# Patient Record
Sex: Male | Born: 1965 | Race: Black or African American | Hispanic: No | Marital: Single | Smoking: Never smoker
Health system: Southern US, Community
[De-identification: ages and names within clinical notes are randomized; demographics above are authoritative.]

## PROBLEM LIST (undated history)

## (undated) ENCOUNTER — Emergency Department (HOSPITAL_BASED_OUTPATIENT_CLINIC_OR_DEPARTMENT_OTHER)

## (undated) DIAGNOSIS — K219 Gastro-esophageal reflux disease without esophagitis: Secondary | ICD-10-CM

## (undated) DIAGNOSIS — I10 Essential (primary) hypertension: Secondary | ICD-10-CM

## (undated) DIAGNOSIS — IMO0002 Reserved for concepts with insufficient information to code with codable children: Secondary | ICD-10-CM

## (undated) DIAGNOSIS — M1990 Unspecified osteoarthritis, unspecified site: Secondary | ICD-10-CM

## (undated) DIAGNOSIS — G4730 Sleep apnea, unspecified: Secondary | ICD-10-CM

## (undated) DIAGNOSIS — L98499 Non-pressure chronic ulcer of skin of other sites with unspecified severity: Secondary | ICD-10-CM

## (undated) DIAGNOSIS — R229 Localized swelling, mass and lump, unspecified: Secondary | ICD-10-CM

## (undated) DIAGNOSIS — R519 Headache, unspecified: Secondary | ICD-10-CM

## (undated) DIAGNOSIS — G8929 Other chronic pain: Secondary | ICD-10-CM

## (undated) DIAGNOSIS — M549 Dorsalgia, unspecified: Secondary | ICD-10-CM

## (undated) DIAGNOSIS — IMO0001 Reserved for inherently not codable concepts without codable children: Secondary | ICD-10-CM

## (undated) HISTORY — PX: TOTAL KNEE ARTHROPLASTY: SHX125

## (undated) HISTORY — PX: ANTERIOR CRUCIATE LIGAMENT REPAIR: SHX115

## (undated) HISTORY — PX: BACK SURGERY: SHX140

---

## 2001-09-23 ENCOUNTER — Emergency Department (HOSPITAL_COMMUNITY): Admission: EM | Admit: 2001-09-23 | Discharge: 2001-09-23 | Payer: Self-pay | Admitting: *Deleted

## 2001-09-23 ENCOUNTER — Encounter: Payer: Self-pay | Admitting: *Deleted

## 2001-11-01 ENCOUNTER — Ambulatory Visit (HOSPITAL_COMMUNITY): Admission: RE | Admit: 2001-11-01 | Discharge: 2001-11-01 | Payer: Self-pay | Admitting: Orthopedic Surgery

## 2001-11-01 ENCOUNTER — Encounter: Payer: Self-pay | Admitting: Orthopedic Surgery

## 2006-08-30 ENCOUNTER — Emergency Department (HOSPITAL_COMMUNITY): Admission: EM | Admit: 2006-08-30 | Discharge: 2006-08-30 | Payer: Self-pay | Admitting: Emergency Medicine

## 2006-08-31 ENCOUNTER — Ambulatory Visit (HOSPITAL_BASED_OUTPATIENT_CLINIC_OR_DEPARTMENT_OTHER): Admission: RE | Admit: 2006-08-31 | Discharge: 2006-08-31 | Payer: Self-pay | Admitting: *Deleted

## 2006-08-31 ENCOUNTER — Encounter (INDEPENDENT_AMBULATORY_CARE_PROVIDER_SITE_OTHER): Payer: Self-pay | Admitting: *Deleted

## 2007-07-29 ENCOUNTER — Emergency Department (HOSPITAL_COMMUNITY): Admission: EM | Admit: 2007-07-29 | Discharge: 2007-07-29 | Payer: Self-pay | Admitting: Emergency Medicine

## 2010-06-25 NOTE — Op Note (Signed)
Evan Kim, Evan Kim            ACCOUNT NO.:  [id number]   MEDICAL RECORD NO.:  [id number]          PATIENT TYPE:  AMB   LOCATION:  DSC                          FACILITY:  [facility]   PHYSICIAN:  Tennis Must Meyerdierks, M.D.DATE OF BIRTH:  Mar 30, 1965   DATE OF PROCEDURE:  08/31/2006  DATE OF DISCHARGE:                               OPERATIVE REPORT   PREOPERATIVE DIAGNOSES:  Osteomyelitis, distal phalanx left long finger.   POSTOPERATIVE DIAGNOSES:  Osteomyelitis, distal phalanx left long  finger.   PROCEDURE:  Incision and drainage of abscess left long finger.   SURGEON:  Dr. Metro Kung.   ANESTHESIA:  General.   OPERATIVE FINDINGS:  The patient had gross purulent material beneath the  eponychium fold and in the pulp of the long finger.  The bone was quite  solid.   PROCEDURE:  Under general anesthesia with a tourniquet on the left arm,  the left hand was prepped and draped in the usual fashion and after  exsanguinating the limb the tourniquet was inflated to 250 mmHg.  A  Freer elevator was placed under the eponychium fold and gross purulence  was obtained.  Cultures were sent to the lab.  The nail plate was then  removed with a Therapist, nutritional.  A longitudinal incision was made on the  radial side of the distal phalanx in line with the previous incision  made in the emergency room.  It was carried through the subcutaneous  tissues and again gross purulent material was obtained.  Blunt  dissection was carried across the volar aspect of the distal phalanx  releasing the pressure on the pulp.  The bone was then curetted and a  small fragment was taken to send to the lab to evaluate for  osteomyelitis.  The wound was then irrigated copiously.  It was packed  open with Iodoform packing.  Sterile dressings were applied.  A 0.5%  Marcaine digital block was inserted for pain control.  The patient  tolerated the procedure well and went to the recovery room awake in  stable and  good condition.      Lowell Bouton, M.D.  Electronically Signed     EMM/MEDQ  D:  08/31/2006  T:  08/31/2006  Job:  [id number]

## 2010-06-28 NOTE — Op Note (Signed)
NAME:  Evan Kim, Evan Kim                      ACCOUNT NO.:  [id number]   MEDICAL RECORD NO.:  [id number]                   PATIENT TYPE:  AMB   LOCATION:  DAY                                  FACILITY:  [facility]   PHYSICIAN:  James P. Aplington, M.D.            DATE OF BIRTH:  [birth year]   DATE OF PROCEDURE:  DATE OF DISCHARGE:                                 OPERATIVE REPORT   PREOPERATIVE DIAGNOSES:  1. Chronic pain, swelling and giving way, right knee.  2. Osteoarthritis, right knee.  3. Retained screws and washers femur and tibia, right knee, status post     anterior cruciate ligament reconstruction.   POSTOPERATIVE DIAGNOSES:  1. Torn lateral and medial menisci.  2. 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  3. Retained screw and washer, right femur and tibia.   OPERATION:  1. Right knee arthroscopy with 1) partial medial and lateral meniscectomy,     2) debridement of medial femoral condyle and patella.  2. Removal of screw and washer, right tibia.  3. Attempted removal of screw and washer, right femur.   SURGEON:  Illene Labrador. Aplington, M.D.   ASSISTANT:  Nurse.   PATHOLOGY AND JUSTIFICATION FOR PROCEDURE:  Some 15-16 years ago, he had ACL  reconstruction in [employer]. He has had significant problems with pain and  swelling requiring several aspirations of the knee at the [facility] and giving way of the knee since that time. Plain x-rays  show osteoarthritis of the knee, his knee seemed stable preoperatively. The  screw to the tibia was palpable and seemed to be giving him trouble. There  were some questions whether or not the screw to the femur was.  See  operative description below for additional details.   DESCRIPTION OF PROCEDURE:  Satisfactory general anesthesia, pneumatic  tourniquet, thigh stabilizer, right knee was prepped with Duraprep and  draped in a sterile field. I brought in the C-arm and localized the areas  of  the two screws. I then outlined the patella and placement of the medial and  lateral portals as well as the superior and medial saline inflow portals.  First through an anterior lateral portal, the medial compartment of the knee  joint was evaluated. He had a little balled up material of what appeared to  be left over from the ACL reconstruction which might have been a little bit  of an impingement problem and I had dissected this with a 3.5 shaver. The  ACL appeared to be intact and the knee seemed to be stable on testing the  anterior drawer sign and locking tests. He had grade 2/4 chondromalacia of  the medial femoral condyle which I both gently abraded and debrided up with  baskets and the 3.5 shaver. Posteriorly he had a fairly extensive tear  involving the entire posterior third of the medial meniscus with a  perpendicular tear all the  way back to the synovial attachment. We managed  this by resecting the torn flaps of meniscus and tapering the larger  anterior portion with baskets and then shaving down until smooth with a 3.5  shaver. Looking up in the medial gutter and suprapatellar area, he had some  moderate grade 2/4 wear of the patella which I shaved. I then reversed  portals and took another picture of the ACL which appeared to be intact. The  joint surfaces looked reasonably good, we did have a tear of the mid third  of the lateral meniscus which I pictured and then shaved down until smooth  with a 3.5 shaver. The knee joint was then irrigated until clear and all  fluid possible removed. The two anterior portals were closed with 4-0 nylon,  20 cc of 0.5% Marcaine with adrenaline and 4 mg of morphine were then  instilled through the inflap rasp which was removed and this portal closed  with 4-0 nylon as well. All three portals were infiltrated with 0.5% plain  Marcaine. I then made an incision over the tibial screw which was palpable,  the screw and plastic washer beneath  it were removed without difficulty. The  area was infiltrated with 0.5% plain Marcaine. The subcutaneous tissue was  closed with 2-0 Vicryl and skin with interrupted 4-0 nylon. I then made an  incision over the lateral distal femur, went down through the fascia lata  and found the screw but the hole for the screwdriver had been stripped and I  was unable to budge the screw whatsoever. I also was unable to grab any  purchase on the screw head because of the plastic washer beneath, so despite  locking pliers I was unable to budge the screw. Rather than make a large  hole in his femur for what seemed to be a fairly nonpathologic problem, I  elected to leave the screw at washer at this time. This wound was also  irrigated with sterile saline, infiltrated with 0.5% Marcaine. The fascia  was closed with interrupted 2-0 Vicryl as well as the subcutaneous tissue  and the skin with 4-0 nylon. Betadine Adaptic dry sterile dressing were  applied, the tourniquet was released. He tolerated the procedure well and  was taken to the recovery room in satisfactory condition with no known  complications.                                               James P. Aplington, M.D.    JPA/MEDQ  D:  11/01/2001  T:  11/01/2001  Job:  [id number]

## 2010-11-07 LAB — DIFFERENTIAL
Eosinophils Relative: 1
Lymphocytes Relative: 20
Lymphs Abs: 2.1
Neutro Abs: 7.5
Neutrophils Relative %: 72

## 2010-11-07 LAB — POCT I-STAT, CHEM 8
BUN: 12
Chloride: 103
Creatinine, Ser: 1.2
Potassium: 3.9
Sodium: 138

## 2010-11-07 LAB — CBC
HCT: 46.9
Hemoglobin: 15.6
MCHC: 33.2
MCV: 87.9
Platelets: 239
RBC: 5.33
RDW: 14.2
WBC: 10.5

## 2010-11-25 LAB — WOUND CULTURE

## 2010-11-25 LAB — ANAEROBIC CULTURE: Gram Stain: NONE SEEN

## 2010-11-25 LAB — POCT HEMOGLOBIN-HEMACUE: Hemoglobin: 15.2

## 2011-10-07 NOTE — Unmapped External Note (Signed)
 Physical Therapy Comprehensive Spine Evaluation   Payor: MEDICAID [place]   Plan: MEDICAID OF [place]  Product Type: Indemnity    Visit Count:  1  Referring Diagnosis: Low Back pain Date of Onset:  Nov. 2011 Referring Clinician:  Leontine Overton Clark, MD  Referring Service/Team:  [facility] 07 [facility] Demographics:  Age: 46 y.o.  Gender: male  History of Present Illness:  Reports that he has had continued pain since fusion surgery in November 2011.  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  Reports that he is having a lot of spasms and pain.  Reports that he is still using the cane but that his right and left sides hurt more depending on which side he uses the cane.  Reports that they want to do surgery but he does not want anymore surgery.    Reports that there was a level during the fusion surgery that he could not fuse.  Reports that the cysts were present at that time and that is why they could not fuse.    MRI 12/12: . L2-L3: Bilateral facet hypertrophy without significant stenosis.  SABRA L3-L4: Facet hypertrophy and ligamentum flavum thickening without significant stenosis.  SABRA L4-L5: Broad-based disc bulge, facet hypertrophy and ligamentum flavum thickening result in mild bilateral foraminal narrowing. Disc space narrowing and disc desiccation.  SABRA L5-S1: Limited evaluation due to susceptibility artifact. Broad-based disc bulge and facet hypertrophy without significant stenosis.  SABRA Upper Sacrum/Ilium: Multiple bilateral anterior sacral meningoceles at S1-S2, S2-S3 and S3-S4 appear similar to prior considering differences in technique, with the largest measuring 3.4 cm on coronal view.  .  CONCLUSION:  1. Post surgical changes related to posterior lumbar interbody fusion at L5-S1. Grade 1 anterolisthesis of L4 on L5, unchanged.  2. Stable mild lumbar spondylosis most prominent at L4-L5 and L5-S1 resulting in mild  bilateral foraminal narrowing.  3. 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   Significant Past Medical History:   Past Medical History  Diagnosis Date  . Pars defect of lumbar spine   . Chronic back pain 12/24/2010  . GERD (gastroesophageal reflux disease)   . Hypertension 09/18/2011   Concurrent Services:  None Therapy within Past Year:  yes - PT after surgery.   Medications:  Current outpatient prescriptions:cyclobenzaprine  (FLEXERIL ) 10 MG tablet, Take 1 tablet (10 mg total) by mouth 2 (two) times daily as needed., Disp: 30 tablet, Rfl: 5;  gabapentin  (NEURONTIN ) 300 MG capsule, Take 1 capsule (300 mg total) by mouth 3 times daily., Disp: 90 capsule, Rfl: 5;  hydrochlorothiazide (HYDRODIURIL) 12.5 MG tablet, Take 1 tablet (12.5 mg total) by mouth daily., Disp: 30 tablet, Rfl: 5 pantoprazole  (PROTONIX ) 40 MG tablet, Take 1 tablet (40 mg total) by mouth daily., Disp: 30 tablet, Rfl: 5 Allergies:   Allergies as of 10/07/2011 - Review Complete 10/07/2011  Allergen Reaction Noted  . Oxycontin  (oxycodone ) Urticaria / Hives (ALLERGY) 05/14/2010    Rehabilitation Precautions/Restrictions:     Fall Risk Screen:  Fall Risk Screen Fall in the last year?: Yes An injury due to a fall?: Yes (reports went to ED, fell getting into chair, eye injury.) Feel unsteady or off balance?: Yes  SUBJECTIVE Patient reports that he continues to be in chronic pain and does not get any sleep and still going through the same thing.  Reports that he is not getting any  exercises at present time due to pain.  Reports that he is tired of dealing with it.   Work Status Current: out of work Current Functional Limitations: Sitting / standing, walking. Cannot get any relief.  Posture/Stresses:  Able to change ad lib.  Sleep:  Patient's sleep is disturbed.  Pillows: one  Sleeping Surface: soft  Sleeping Posture: left side or right side.  Patient  Goals:  To get back to loosen up and decrease pain. Pain:   150/10 low back into middle back and into bilateral buttock.  Self Reported Quality of Life:  At present time, patient reports having Fair quality of life/health status.   OBJECTIVE  General Observation:  Male in slight distress ambulating without cane and sitting in flattened posturing on exam table without frequent shifting or pain behaviors.   Lumbar Worse with:  Bending, Sitting, Rising, Lying and reports has constant pain Supine locks back up.    Better with:  reports no position that makes it better but are positions that make it worse.   Symptom Behavior Previous Episodes:  Year of first episode:  prior to 2011. Previous Treatments:  Fusion surgery, PT for ambulation / balance, strengthening.  Bladder Function:  Continent Cough/Sneeze/Strain:  Symptoms are increased with cough/sneeze/strain. Night Pain:  Yes Unexplained Weight Loss:  No Gait:  Abnormal:  reports leg weakness, pain and frequent falls.   LUMBAR    Posture  Sitting Posture:  Poor  Standing Posture:  Fair  Lumbar Lordosis:  Decreased  Lateral Shift:  None  Other Observations:  Slumped sitting posturing and flattened lumbar spine.  Standing flexed at hips.  Lumbar brace in place     Neurological  Motor Deficit:  knees and ankles bilaterally grossly 4/5, hip flexion 3/5 with pain generated in low back.  Sensory Deficit:  Grossly intact bilateral LE's.   Reflexes:  Not tested  Dural Signs:  not tested     Movement Loss  Flexion Loss:   Major (greater than 50% loss) and pain during motion  Extension Loss:  Major (greater than 50% loss), End Range Pain and reports the pain and not the brace stopping motion.  Right Sidegliding Loss:  Moderate (up to 50% loss) and End Range Pain  Left Sidegliding Loss:  Moderate (up to 50% loss) and End Range Pain  Deviation in Flexion:  None  Deviation in Extension:  None       Test Movements  Pretest Pain  Standing:  10-12/10 low back, mid back, and buttock  Flexion in Standing During Testing:  Not tested  Extension in Standing During Testing:  Reports placing more pressure into buttock, increasing lower back pain.  Flexion in Lying During Testing:   Reports increased pain due to pressure on back.  Reports a pulling sensation in low back during bringing knees to chest.    Extension in Lying During Testing:  Not able to perform through full motion, only able to do partial ROM with elbows moderately flexed.    Reports spreading down into buttocks.    Sidegliding/Rotation:  Not assessed/required  Static Tests:  Lying prone in extension:  Reports central low back pain.  Up on elbows reports takes pressure off of stomach but still has central low back pain.  With repetitions reports pain remains in central low back.    Other:  1.  None Required  Conclusion:  Cysts not causing lumbar complaints as they are distal to the complaints of pain.  Patient with multiple HNP and  in flexed posturing and avoidant of neutral and extension which centralizes complaints.  Needs further ROM into extension to attempt to reduce posterior derangements.   Oswestry Pain Intensity: Pain severe, does not vary much Personal Care: Washing and dressing increases pain, don't change method Lifting: Only lift light loads at the most Walking: Can only walk while using cane or crutches Sitting: Pain prevents me from sitting at all Standing: Avoid standing, increases pain right away Sleeping: Pain prevents me from sleeping at all Social Life: Hardly any social life due to pain Travelling: Pain restricts all forms of travel Changing Degree of Pain: Pain rapidly worsening Oswestry Total (pct): 90  Bed bound / Exaggerating   Interventions:   SELF CARE MANAGEMENT:  Instruction in evaluation process, directional preference, anatomy of complaints of pain, centralisation/ peripheralisation of pain, role of therapy, POC, and progression  of therapy services.  Patient was with good understanding.   Multiple times of education for anatomy and centralization reviewed.  SELF CARE MANAGEMENT:  Instruction in HEP of static / progressive extension in lying, REIS (Repeated extension in standing) and proper sitting posturing with use of lumbar roll.  Instruction to do exercises hourly as well as whenever symptoms are present, rationale for this reviewed and handout given. SABRA  SELF CARE MANAGEMENT:  Discussed return appointments and progression of restoring functioning with decreased complaints of pain.  Patient was in agreement   Modalities Skin Integrity Assessment:   Non Applicable  Education:    1.  Yes, provided as follows:   Barriers to Learning:  No Barriers   Learning Needs:  Illness/disease, Treatment plan, Rehabilitation techniques and procedures, Precautions and When/how to obtain future treatment   Education Provided:  Plan of Care, Instructed, practiced, and given home exercise program, Precautions and Pain management   Audience:  Patient   Mode:  Explanation, Demonstration and Printed material provided   Response:  Applied knowledge and verbalized understanding  2.  Unable to provide/assess secondary to n/a   ASSESSMENT Patient with posterior derangement and complaints of low back pain coming from HNP not from cysts.  Patient needs further end range extension to clear obstruction.  Patient with pain focus behavior that is barrier to progression and performance of activities.   Therapy Diagnosis:   1. Chronic back pain  Ambulatory referral to Physical Therapy  2. Difficulty in walking     Problem List:  Decreased endurance, Decreased ROM, Decreased strength, Difficulty bending, Difficulty lifting, Difficulty with prolonged sitting, Difficulty with prolonged standing, Impaired ambulation, Impaired balance and Pain  Equipment Needed:  Straight Cane due to history of falls.  Reports that he does not always use his cane.    Other Rehabilitation Considerations:  Poor pain tolerance  Response to Evaluation:  The sessin was tolerated fair, as evidenced by improved centralization of complaints of pain with extension and need for static performance to clear.  Patient remains pain focused.   Rehabilitation Potential:    Motivation/Commitment to Therapy:  Good  Rehabilitation Potential:  Guarded  Support Structure:  Good  Family member willing to assist  Goals:   Time frame to achieve goal:: 3 visits Goal 1: Patient to be instructed in and independent in proper sitting posturing with use of lumbar roll for prevention of flexion in sitting.  Goal 2: Patient to demonstrate extension ROM to moderate loss for progression of clearing of posterior derangement.  Goal 3: Patient to report improved compliance with use of cane for ambulation for prevention of falls.  Goal 4: Patient to be instructed in and independent in HEP for stabilizaiton and strengthening for self progression.  Goal 5: Patient to report the ability to stand for 10 minutes for meal prep and ADL's. Goal 6: Patient to decrease Oswestry Disability Index score by one category (evaluation 90% Bed Bound / Exaggeration, goal 80% or less Crippled) as indication of decreased pain and increased functioning.     PLAN  Treatment Frequency, Duration, and Interventions:  Plan Treatment Frequency and duration : current visit limitation due to MCD of 3 visits.  Recommend PT procedures: Therapeutic Exercise;Manual Therapy;Self Care / Home Management Necessity: Required to return to Premorbid environment (or reside in new living environment)?  no Required to reduce ADL or IADL assistance to Premorbid level?  yes  Recommended Consults:  None currently  Development of Plan of Care:  Patient participated in plan of care development today.  Total Treatment Time (Timed & Untimed):  58 Total Time in Timed Codes:  26  The patient has been instructed to contact our  clinic if any questions or problems should arise.    Medicare Certification Not Required  Electronically signed by: Devere Grand, PT 10/07/2011 10:25 AM

## 2012-01-28 ENCOUNTER — Encounter (HOSPITAL_BASED_OUTPATIENT_CLINIC_OR_DEPARTMENT_OTHER): Payer: Self-pay | Admitting: *Deleted

## 2012-01-28 ENCOUNTER — Emergency Department (HOSPITAL_BASED_OUTPATIENT_CLINIC_OR_DEPARTMENT_OTHER)
Admission: EM | Admit: 2012-01-28 | Discharge: 2012-01-28 | Disposition: A | Discharge status: Home / Self Care | Payer: Medicaid Other | Attending: Emergency Medicine | Admitting: Emergency Medicine

## 2012-01-28 DIAGNOSIS — M545 Low back pain, unspecified: Secondary | ICD-10-CM | POA: Insufficient documentation

## 2012-01-28 DIAGNOSIS — R197 Diarrhea, unspecified: Secondary | ICD-10-CM | POA: Insufficient documentation

## 2012-01-28 DIAGNOSIS — Z885 Allergy status to narcotic agent status: Secondary | ICD-10-CM | POA: Insufficient documentation

## 2012-01-28 DIAGNOSIS — R1110 Vomiting, unspecified: Secondary | ICD-10-CM

## 2012-01-28 DIAGNOSIS — R1084 Generalized abdominal pain: Secondary | ICD-10-CM | POA: Insufficient documentation

## 2012-01-28 DIAGNOSIS — R112 Nausea with vomiting, unspecified: Secondary | ICD-10-CM | POA: Insufficient documentation

## 2012-01-28 DIAGNOSIS — K219 Gastro-esophageal reflux disease without esophagitis: Secondary | ICD-10-CM | POA: Insufficient documentation

## 2012-01-28 DIAGNOSIS — Z9889 Other specified postprocedural states: Secondary | ICD-10-CM | POA: Insufficient documentation

## 2012-01-28 DIAGNOSIS — Z8711 Personal history of peptic ulcer disease: Secondary | ICD-10-CM | POA: Insufficient documentation

## 2012-01-28 DIAGNOSIS — Z79899 Other long term (current) drug therapy: Secondary | ICD-10-CM | POA: Insufficient documentation

## 2012-01-28 DIAGNOSIS — Z96659 Presence of unspecified artificial knee joint: Secondary | ICD-10-CM | POA: Insufficient documentation

## 2012-01-28 HISTORY — DX: Non-pressure chronic ulcer of skin of other sites with unspecified severity: L98.499

## 2012-01-28 HISTORY — DX: Reserved for inherently not codable concepts without codable children: IMO0001

## 2012-01-28 HISTORY — DX: Other chronic pain: G89.29

## 2012-01-28 HISTORY — DX: Gastro-esophageal reflux disease without esophagitis: K21.9

## 2012-01-28 HISTORY — DX: Dorsalgia, unspecified: M54.9

## 2012-01-28 LAB — BASIC METABOLIC PANEL
CO2: 26 mEq/L (ref 19–32)
Calcium: 9.9 mg/dL (ref 8.4–10.5)
Chloride: 104 mEq/L (ref 96–112)
Glucose, Bld: 131 mg/dL — ABNORMAL HIGH (ref 70–99)
Sodium: 143 mEq/L (ref 135–145)

## 2012-01-28 LAB — URINALYSIS, ROUTINE W REFLEX MICROSCOPIC
Glucose, UA: NEGATIVE mg/dL
Hgb urine dipstick: NEGATIVE
Leukocytes, UA: NEGATIVE
Protein, ur: NEGATIVE mg/dL
pH: 5 (ref 5.0–8.0)

## 2012-01-28 LAB — CBC WITH DIFFERENTIAL/PLATELET
Eosinophils Relative: 0 % (ref 0–5)
HCT: 47.8 % (ref 39.0–52.0)
Lymphocytes Relative: 6 % — ABNORMAL LOW (ref 12–46)
Lymphs Abs: 0.6 10*3/uL — ABNORMAL LOW (ref 0.7–4.0)
MCV: 83.3 fL (ref 78.0–100.0)
Monocytes Absolute: 0.4 10*3/uL (ref 0.1–1.0)
Neutro Abs: 9.5 10*3/uL — ABNORMAL HIGH (ref 1.7–7.7)
Platelets: 237 10*3/uL (ref 150–400)
RBC: 5.74 MIL/uL (ref 4.22–5.81)
WBC: 10.6 10*3/uL — ABNORMAL HIGH (ref 4.0–10.5)

## 2012-01-28 MED ORDER — HYDROMORPHONE HCL PF 1 MG/ML IJ SOLN
1.0000 mg | Freq: Once | INTRAMUSCULAR | Status: AC
  Administered 2012-01-28: 1 mg via INTRAVENOUS
  Filled 2012-01-28: qty 1

## 2012-01-28 MED ORDER — SODIUM CHLORIDE 0.9 % IV SOLN
Freq: Once | INTRAVENOUS | Status: AC
  Administered 2012-01-28: 13:00:00 via INTRAVENOUS

## 2012-01-28 MED ORDER — ONDANSETRON HCL 4 MG/2ML IJ SOLN
4.0000 mg | Freq: Once | INTRAMUSCULAR | Status: AC
  Administered 2012-01-28: 4 mg via INTRAVENOUS
  Filled 2012-01-28: qty 2

## 2012-01-28 MED ORDER — ONDANSETRON 8 MG PO TBDP: 8.0000 mg | ORAL_TABLET | Freq: Three times a day (TID) | ORAL | Status: DC | PRN

## 2012-01-28 MED ORDER — SODIUM CHLORIDE 0.9 % IV BOLUS (SEPSIS)
1000.0000 mL | Freq: Once | INTRAVENOUS | Status: AC
  Administered 2012-01-28: 1000 mL via INTRAVENOUS

## 2012-01-28 NOTE — ED Notes (Signed)
MD at bedside.-pt requested pain med-EDPA notified and is at [facility]

## 2012-01-28 NOTE — ED Notes (Signed)
Patient states he woke this morning with nausea, vomiting and diarrhea.  States the N/V/D has been non-stop, and is associated with generalized abdominal pain and lower back pain.  States he has chronic lower back problems, surgery one year ago and is currently working doctor for his back problems.

## 2012-01-28 NOTE — ED Provider Notes (Signed)
History     CSN: [id number]  Arrival date & time 01/28/12  1153   First MD Initiated Contact with Patient 01/28/12 1245      Chief Complaint  Patient presents with  . Emesis  . Diarrhea    (Consider location/radiation/quality/duration/timing/severity/associated sxs/prior treatment) Patient is a 46 y.o. male presenting with vomiting. The history is provided by the patient. No language interpreter was used.  Emesis  This is a new problem. The current episode started 6 to 12 hours ago. The problem occurs 5 to 10 times per day. The problem has been gradually worsening. The emesis has an appearance of stomach contents. There has been no fever. Associated symptoms include abdominal pain and diarrhea. Risk factors include ill contacts.  Pt complains of nausea, vomiting and diarrhea.   Pt reports he has chronic back pain and vomiting has made his back worse.   Pt's regular Md is in [place].   Pt normally takes hydrocodone for pain  Past Medical History  Diagnosis Date  . Back pain, chronic   . Reflux   . Ulcer disease     Past Surgical History  Procedure Date  . Back surgery   . Total knee arthroplasty   . Anterior cruciate ligament repair     bilateral knees    No family history on file.  History  Substance Use Topics  . Smoking status: Never Smoker   . Smokeless tobacco: Not on file  . Alcohol Use: No      Review of Systems  Gastrointestinal: Positive for nausea, vomiting, abdominal pain and diarrhea.  Musculoskeletal: Positive for back pain.  All other systems reviewed and are negative.    Allergies  Oxycodone  Home Medications   Current Outpatient Rx  Name  Route  Sig  Dispense  Refill  . OMEPRAZOLE 20 MG PO CPDR   Oral   Take 20 mg by mouth daily.           BP 142/102  Pulse 111  Temp 98.3 F (36.8 C) (Oral)  Resp 20  Ht 6\' 3"  (1.905 m)  Wt 230 lb (104.327 kg)  BMI 28.75 kg/m2  SpO2 100%  Physical Exam  Nursing note and vitals  reviewed. Constitutional: He is oriented to person, place, and time. He appears well-developed and well-nourished.  HENT:  Head: Normocephalic.  Right Ear: External ear normal.  Mouth/Throat: Oropharynx is clear and moist.  Eyes: Conjunctivae normal and EOM are normal. Pupils are equal, round, and reactive to light.  Neck: Normal range of motion. Neck supple.  Cardiovascular: Normal rate.   Pulmonary/Chest: Effort normal.  Abdominal: Soft.  Musculoskeletal: Normal range of motion.  Neurological: He is alert and oriented to person, place, and time. He has normal reflexes.  Skin: Skin is warm.  Psychiatric: He has a normal mood and affect.    ED Course  Procedures (including critical care time)  Labs Reviewed  URINALYSIS, ROUTINE W REFLEX MICROSCOPIC - Abnormal; Notable for the following:    Specific Gravity, Urine 1.031 (*)     All other components within normal limits  CBC WITH DIFFERENTIAL  BASIC METABOLIC PANEL   No results found.   No diagnosis found.    MDM  Labs reviewed,   Pt given Iv fluids x 2 liters,   Pt given zofran IV and dilaudid.  No vomiting.   Pt tolerating po fluids,        Lonia Skinner [place], [place] 01/28/12 1432

## 2012-01-28 NOTE — ED Notes (Signed)
Pt with no V/D while in ED except for vomited x 1 upon arrival

## 2012-01-29 NOTE — ED Provider Notes (Signed)
Medical screening examination/treatment/procedure(s) were performed by non-physician practitioner and as supervising physician I was immediately available for consultation/collaboration.   Loren Racer, MD 01/29/12 [phone]

## 2012-02-11 HISTORY — PX: BACK SURGERY: SHX140

## 2012-02-19 NOTE — Unmapped External Note (Signed)
 Physical Therapy Discharge Summary  Payor:  No coverage found.   Referring Diagnosis: Low Back pain  Date of Onset: Nov. 2011  Referring Clinician: Leontine Overton Clark, MD  Referring Service/Team: [facility]   Therapy Diagnosis:   1. Backache, unspecified   2. Other chronic pain   3. Difficulty in walking    Rehabilitation Precautions/Restrictions:     Initial Evaluation Date:  10/07/11 Reporting Period:  10/07/11 to 10/21/11 Number of Visits to Date:  2 Number of Missed Visits: 1, failed last visit   SUBJECTIVE At only follow up patient reported:  Patient reports that he is doing HEP about 2-3 times a week as reports that he has a lot of low back pain when performing. Reports that he has been trying to do the up on the elbows but reports that hurts across low back so doesn't do it very much.   Pain: 8/10 low back and down right leg to foot.     OBJECTIVE  General Observation:  ROM lumbar spine moderate to major loss flexion, major loss extension, moderate loss bilateral lateral flexion.    Interventions:  see daily note for 10/21/11  Modalities Skin Integrity Assessment:   Non Applicable  Education:  see note   ASSESSMENT  Therapy Program to Date: In summary, the therapy program has included: Education, posturing, symptom management,  HEP.   Response to Visit/Summary: Patient continues to demonstrate response to extension principle but needs increased performance of extension to clear obstruction and decrease overall complaints of pain but remains with pain focus behavior and limiting factor.   Progress Towards Goals:  Time frame to achieve goal:: 3 visits  Goal 1: Patient to be instructed in and independent in proper sitting posturing with use of lumbar roll for prevention of flexion in sitting.  Goal 1 Comments/Progress: Not Met, flexed in sitting  Goal 2: Patient to demonstrate extension ROM to moderate loss for progression of clearing of posterior  derangement.  Goal 2 Comments/Progress: Not Met, remains with major loss  Goal 3: Patient to report improved compliance with use of cane for ambulation for prevention of falls.  Goal 3 Comments/Progress: Not met, no assistive device for ambulation.  Goal 4: Patient to be instructed in and independent in HEP for stabilizaiton and strengthening for self progression.  Goal 4 Comments/Progress: Not Met, ongoing for symptom management.  Goal 5: Patient to report the ability to stand for 10 minutes for meal prep and ADL's.  Goal 5 Comments/Progress: Not met, ongoing.  Goal 6: Patient to decrease Oswestry Disability Index score by one category (evaluation 90% Bed Bound / Exaggeration, goal 80% or less Crippled) as indication of decreased pain and increased functioning.  Goal 6 Comments/Progress: Not Met, not yet reassessed.        Progress Summary: Patient hesitant to do HEP due to reports of cysts.  Education in posturing.  Patient failed last scheduled appointment.   PLAN  Recommendations:  Physical Therapy services are discontinued at this time secondary to:  lapse in time since last visit.   Development of Plan of Care:  patient has not returned.   Total Treatment Time (Timed & Untimed):  0 Total Time in Timed Codes:  0     Electronically signed by: Devere Grand, PT 02/19/2012 1:29 PM

## 2012-07-18 ENCOUNTER — Emergency Department (HOSPITAL_BASED_OUTPATIENT_CLINIC_OR_DEPARTMENT_OTHER)
Admission: EM | Admit: 2012-07-18 | Discharge: 2012-07-18 | Disposition: A | Discharge status: Home / Self Care | Payer: Medicaid Other | Attending: Emergency Medicine | Admitting: Emergency Medicine

## 2012-07-18 ENCOUNTER — Encounter (HOSPITAL_BASED_OUTPATIENT_CLINIC_OR_DEPARTMENT_OTHER): Payer: Self-pay

## 2012-07-18 DIAGNOSIS — IMO0002 Reserved for concepts with insufficient information to code with codable children: Secondary | ICD-10-CM | POA: Insufficient documentation

## 2012-07-18 DIAGNOSIS — Y9301 Activity, walking, marching and hiking: Secondary | ICD-10-CM | POA: Insufficient documentation

## 2012-07-18 DIAGNOSIS — Z96659 Presence of unspecified artificial knee joint: Secondary | ICD-10-CM | POA: Insufficient documentation

## 2012-07-18 DIAGNOSIS — Z872 Personal history of diseases of the skin and subcutaneous tissue: Secondary | ICD-10-CM | POA: Insufficient documentation

## 2012-07-18 DIAGNOSIS — Z8739 Personal history of other diseases of the musculoskeletal system and connective tissue: Secondary | ICD-10-CM | POA: Insufficient documentation

## 2012-07-18 DIAGNOSIS — K219 Gastro-esophageal reflux disease without esophagitis: Secondary | ICD-10-CM | POA: Insufficient documentation

## 2012-07-18 DIAGNOSIS — S81009A Unspecified open wound, unspecified knee, initial encounter: Secondary | ICD-10-CM | POA: Insufficient documentation

## 2012-07-18 DIAGNOSIS — Y9289 Other specified places as the place of occurrence of the external cause: Secondary | ICD-10-CM | POA: Insufficient documentation

## 2012-07-18 DIAGNOSIS — S81812A Laceration without foreign body, left lower leg, initial encounter: Secondary | ICD-10-CM
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
## 2012-07-18 NOTE — ED Notes (Signed)
MD at bedside. 

## 2012-07-18 NOTE — ED Notes (Signed)
Wound care completed using 4x4's and kerlix secured with tape. Patient given socks and additional supplies for wound care.

## 2012-08-08 ENCOUNTER — Emergency Department (HOSPITAL_BASED_OUTPATIENT_CLINIC_OR_DEPARTMENT_OTHER)
Admission: EM | Admit: 2012-08-08 | Discharge: 2012-08-08 | Disposition: A | Discharge status: Home / Self Care | Payer: Medicaid Other | Attending: Emergency Medicine | Admitting: Emergency Medicine

## 2012-08-08 ENCOUNTER — Encounter (HOSPITAL_BASED_OUTPATIENT_CLINIC_OR_DEPARTMENT_OTHER): Payer: Self-pay | Admitting: *Deleted

## 2012-08-08 DIAGNOSIS — Z4802 Encounter for removal of sutures: Secondary | ICD-10-CM

## 2012-08-08 DIAGNOSIS — K219 Gastro-esophageal reflux disease without esophagitis: Secondary | ICD-10-CM | POA: Insufficient documentation

## 2012-08-08 DIAGNOSIS — Z872 Personal history of diseases of the skin and subcutaneous tissue: Secondary | ICD-10-CM | POA: Insufficient documentation

## 2012-08-08 DIAGNOSIS — Z79899 Other long term (current) drug therapy: Secondary | ICD-10-CM | POA: Insufficient documentation

## 2012-08-08 NOTE — ED Notes (Addendum)
Sutures in left leg need to be removed. Past time by 10 days

## 2012-08-08 NOTE — ED Provider Notes (Signed)
   History    CSN: [id number] Arrival date & time 08/08/12  1046  First MD Initiated Contact with Patient 08/08/12 1142     Chief Complaint  Patient presents with  . Suture / Staple Removal   (Consider location/radiation/quality/duration/timing/severity/associated sxs/prior Treatment) HPI This 47 year old male is here for suture removal of his lower leg he is no redness no pus drainage the wound is healed well no tenderness no pain no complaints no weakness no numbness. Past Medical History  Diagnosis Date  . Back pain, chronic   . Reflux   . Ulcer disease    Past Surgical History  Procedure Laterality Date  . Back surgery    . Total knee arthroplasty    . Anterior cruciate ligament repair      bilateral knees   No family history on file. History  Substance Use Topics  . Smoking status: Never Smoker   . Smokeless tobacco: Not on file  . Alcohol Use: No    Review of Systems See HPI. Allergies  Oxycodone  Home Medications   Current Outpatient Rx  Name  Route  Sig  Dispense  Refill  . esomeprazole (NEXIUM) 20 MG capsule   Oral   Take 20 mg by mouth daily before breakfast.          BP 120/89  Pulse 57  Resp 20  SpO2 100% Physical Exam  Nursing note and vitals reviewed. Constitutional:  Awake, alert, nontoxic appearance.  HENT:  Head: Atraumatic.  Eyes: Right eye exhibits no discharge. Left eye exhibits no discharge.  Neck: Neck supple.  Pulmonary/Chest: Effort normal. He exhibits no tenderness.  Abdominal: Soft. There is no tenderness. There is no rebound.  Musculoskeletal: He exhibits no edema and no tenderness.  Baseline ROM, no obvious new focal weakness.  Affected lower leg has a wound has healed well with no redness no tenderness no pus drainage no dehiscence.  Distally has no tenderness and does have good movement with normal light touch.  Neurological:  Mental status and motor strength appears baseline for patient and situation.  Skin: No rash  noted.  Psychiatric: He has a normal mood and affect.    ED Course  Procedures (including critical care time) Labs Reviewed - No data to display No results found. 1. Visit for suture removal     MDM    Hurman Horn, MD 08/08/12 2121

## 2013-03-10 ENCOUNTER — Emergency Department (HOSPITAL_BASED_OUTPATIENT_CLINIC_OR_DEPARTMENT_OTHER)
Admission: EM | Admit: 2013-03-10 | Discharge: 2013-03-10 | Disposition: A | Discharge status: Home / Self Care | Payer: Medicaid Other | Attending: Emergency Medicine | Admitting: Emergency Medicine

## 2013-03-10 ENCOUNTER — Encounter (HOSPITAL_BASED_OUTPATIENT_CLINIC_OR_DEPARTMENT_OTHER): Payer: Self-pay | Admitting: Emergency Medicine

## 2013-03-10 ENCOUNTER — Emergency Department (HOSPITAL_BASED_OUTPATIENT_CLINIC_OR_DEPARTMENT_OTHER): Payer: Medicaid Other

## 2013-03-10 ENCOUNTER — Other Ambulatory Visit: Payer: Self-pay

## 2013-03-10 DIAGNOSIS — Z872 Personal history of diseases of the skin and subcutaneous tissue: Secondary | ICD-10-CM | POA: Insufficient documentation

## 2013-03-10 DIAGNOSIS — G8929 Other chronic pain: Secondary | ICD-10-CM | POA: Insufficient documentation

## 2013-03-10 DIAGNOSIS — B349 Viral infection, unspecified: Secondary | ICD-10-CM | POA: Diagnosis present

## 2013-03-10 DIAGNOSIS — B9789 Other viral agents as the cause of diseases classified elsewhere: Secondary | ICD-10-CM | POA: Insufficient documentation

## 2013-03-10 DIAGNOSIS — K219 Gastro-esophageal reflux disease without esophagitis: Secondary | ICD-10-CM | POA: Insufficient documentation

## 2013-03-10 DIAGNOSIS — Z79899 Other long term (current) drug therapy: Secondary | ICD-10-CM | POA: Insufficient documentation

## 2013-03-10 LAB — COMPREHENSIVE METABOLIC PANEL
ALBUMIN: 3.8 g/dL (ref 3.5–5.2)
ALK PHOS: 65 U/L (ref 39–117)
ALT: 28 U/L (ref 0–53)
AST: 19 U/L (ref 0–37)
BUN: 13 mg/dL (ref 6–23)
CO2: 24 mEq/L (ref 19–32)
Calcium: 9.5 mg/dL (ref 8.4–10.5)
Chloride: 102 mEq/L (ref 96–112)
Creatinine, Ser: 1 mg/dL (ref 0.50–1.35)
GFR calc Af Amer: >90 mL/min (ref >90)
GFR calc non Af Amer: 89 mL/min — ABNORMAL LOW (ref >90)
Glucose, Bld: 88 mg/dL (ref 70–99)
POTASSIUM: 4.2 meq/L (ref 3.7–5.3)
SODIUM: 140 meq/L (ref 137–147)
TOTAL PROTEIN: 7.8 g/dL (ref 6.0–8.3)
Total Bilirubin: 0.6 mg/dL (ref 0.3–1.2)

## 2013-03-10 LAB — LIPASE, BLOOD: Lipase: 32 U/L (ref 11–59)

## 2013-03-10 LAB — CBC WITH DIFFERENTIAL/PLATELET
BASOS ABS: 0 10*3/uL (ref 0.0–0.1)
BASOS PCT: 0 % (ref 0–1)
EOS ABS: 0 10*3/uL (ref 0.0–0.7)
Eosinophils Relative: 0 % (ref 0–5)
HCT: 43.7 % (ref 39.0–52.0)
Hemoglobin: 14.9 g/dL (ref 13.0–17.0)
Lymphocytes Relative: 24 % (ref 12–46)
Lymphs Abs: 2.2 10*3/uL (ref 0.7–4.0)
MCH: 29.9 pg (ref 26.0–34.0)
MCHC: 34.1 g/dL (ref 30.0–36.0)
MCV: 87.6 fL (ref 78.0–100.0)
Monocytes Absolute: 0.8 10*3/uL (ref 0.1–1.0)
Monocytes Relative: 8 % (ref 3–12)
NEUTROS PCT: 67 % (ref 43–77)
Neutro Abs: 6.1 10*3/uL (ref 1.7–7.7)
PLATELETS: 255 10*3/uL (ref 150–400)
RBC: 4.99 MIL/uL (ref 4.22–5.81)
RDW: 14.1 % (ref 11.5–15.5)
WBC: 9.1 10*3/uL (ref 4.0–10.5)

## 2013-03-10 LAB — TROPONIN I

## 2013-03-10 MED ORDER — LIDOCAINE HCL (PF) 1 % IJ SOLN
INTRAMUSCULAR | Status: AC
  Administered 2013-03-10: 5 mL
  Filled 2013-03-10: qty 5

## 2013-03-10 MED ORDER — DIPHENHYDRAMINE HCL 50 MG/ML IJ SOLN
25.0000 mg | Freq: Once | INTRAMUSCULAR | Status: AC
  Administered 2013-03-10: 25 mg via INTRAVENOUS
  Filled 2013-03-10: qty 1

## 2013-03-10 MED ORDER — SODIUM CHLORIDE 0.9 % IV BOLUS (SEPSIS)
1000.0000 mL | INTRAVENOUS | Status: AC
  Administered 2013-03-10: 1000 mL via INTRAVENOUS

## 2013-03-10 MED ORDER — AZITHROMYCIN 250 MG PO TABS
1000.0000 mg | ORAL_TABLET | Freq: Once | ORAL | Status: AC
  Administered 2013-03-10: 1000 mg via ORAL
  Filled 2013-03-10: qty 4

## 2013-03-10 MED ORDER — METRONIDAZOLE 500 MG PO TABS
2000.0000 mg | ORAL_TABLET | ORAL | Status: AC
  Administered 2013-03-10: 2000 mg via ORAL
  Filled 2013-03-10: qty 4

## 2013-03-10 MED ORDER — GI COCKTAIL ~~LOC~~
30.0000 mL | Freq: Once | ORAL | Status: AC
  Administered 2013-03-10: 30 mL via ORAL
  Filled 2013-03-10: qty 30

## 2013-03-10 MED ORDER — CEFTRIAXONE SODIUM 250 MG IJ SOLR
250.0000 mg | Freq: Once | INTRAMUSCULAR | Status: AC
  Administered 2013-03-10: 250 mg via INTRAMUSCULAR
  Filled 2013-03-10: qty 250

## 2013-03-10 MED ORDER — METOCLOPRAMIDE HCL 5 MG/ML IJ SOLN
5.0000 mg | Freq: Once | INTRAMUSCULAR | Status: AC
  Administered 2013-03-10: 5 mg via INTRAVENOUS
  Filled 2013-03-10: qty 2

## 2013-03-10 NOTE — ED Provider Notes (Signed)
CSN: [id number]     Arrival date & time 03/10/13  1428 History   First MD Initiated Contact with Patient 03/10/13 1523     Chief Complaint  Patient presents with  . Chest Pain   (Consider location/radiation/quality/duration/timing/severity/associated sxs/prior Treatment) Patient is a 48 y.o. male presenting with chest pain and URI. The history is provided by the patient.  Chest Pain Pain location:  L chest and R chest Pain quality: aching   Pain radiates to:  Does not radiate Pain radiates to the back: no   Pain severity:  Mild Onset quality:  Gradual Duration:  3 days Timing:  Constant Progression:  Unchanged Chronicity:  New Relieved by:  Nothing Worsened by:  Nothing tried Associated symptoms: cough and fever   Associated symptoms: no abdominal pain, no headache, no nausea, no numbness, no shortness of breath and not vomiting  Fatigue: subjective.   URI Presenting symptoms: congestion, cough, fever and rhinorrhea (occasional bloody mucus)  Fatigue: subjective.   Congestion:    Location:  Nasal Cough:    Cough characteristics:  Productive   Sputum characteristics:  Green   Severity:  Mild   Onset quality:  Sudden   Duration:  3 days   Timing:  Constant   Progression:  Unchanged   Chronicity:  New Fever:    Temp source:  Subjective Associated symptoms: no headaches and no neck pain     Past Medical History  Diagnosis Date  . Back pain, chronic   . Reflux   . Ulcer disease    Past Surgical History  Procedure Laterality Date  . Back surgery    . Total knee arthroplasty    . Anterior cruciate ligament repair      bilateral knees   No family history on file. History  Substance Use Topics  . Smoking status: Never Smoker   . Smokeless tobacco: Not on file  . Alcohol Use: No    Review of Systems  Constitutional: Positive for fever. Fatigue: subjective.  HENT: Positive for congestion and rhinorrhea (occasional bloody mucus). Negative for drooling.   Eyes:  Negative for pain.  Respiratory: Positive for cough. Negative for shortness of breath.   Cardiovascular: Positive for chest pain. Negative for leg swelling.  Gastrointestinal: Negative for nausea, vomiting, abdominal pain and diarrhea.  Genitourinary: Negative for dysuria and hematuria.  Musculoskeletal: Negative for gait problem and neck pain.  Skin: Negative for color change.  Neurological: Negative for numbness and headaches.  Hematological: Negative for adenopathy.  Psychiatric/Behavioral: Negative for behavioral problems.  All other systems reviewed and are negative.    Allergies  Oxycodone  Home Medications   Current Outpatient Rx  Name  Route  Sig  Dispense  Refill  . esomeprazole (NEXIUM) 20 MG capsule   Oral   Take 20 mg by mouth daily before breakfast.          BP 127/83  Pulse 86  Temp(Src) 98.6 F (37 C) (Oral)  Resp 20  Ht 6' 2.5" (1.892 m)  Wt 236 lb (107.049 kg)  BMI 29.90 kg/m2  SpO2 99% Physical Exam  Nursing note and vitals reviewed. Constitutional: He is oriented to person, place, and time. He appears well-developed and well-nourished.  HENT:  Head: Normocephalic and atraumatic.  Right Ear: External ear normal.  Left Ear: External ear normal.  Nose: Nose normal.  Mouth/Throat: Oropharynx is clear and moist. No oropharyngeal exudate.  Eyes: Conjunctivae and EOM are normal. Pupils are equal, round, and reactive to light.  Neck: Normal range of motion. Neck supple.  Cardiovascular: Normal rate, regular rhythm, normal heart sounds and intact distal pulses.  Exam reveals no gallop and no friction rub.   No murmur heard. Pulmonary/Chest: Effort normal and breath sounds normal. No respiratory distress. He has no wheezes. He exhibits tenderness (tenderness to palpation of the anterior chest bilaterally.).  Abdominal: Soft. Bowel sounds are normal. He exhibits no distension. There is tenderness (Mild tenderness to palpation of the epigastric area.). There  is no rebound and no guarding.  Musculoskeletal: Normal range of motion. He exhibits no edema and no tenderness.  Neurological: He is alert and oriented to person, place, and time.  Skin: Skin is warm and dry.  Psychiatric: He has a normal mood and affect. His behavior is normal.    ED Course  Procedures (including critical care time) Labs Review Labs Reviewed  COMPREHENSIVE METABOLIC PANEL - Abnormal; Notable for the following:    GFR calc non Af Amer 89 (*)    All other components within normal limits  CBC WITH DIFFERENTIAL  TROPONIN I  LIPASE, BLOOD   Imaging Review Dg Chest 2 View  03/10/2013   CLINICAL DATA:  Chest and abdominal pain for 4 days  EXAM: CHEST  2 VIEW  COMPARISON:  None.  FINDINGS: The heart size and mediastinal contours are within normal limits. Both lungs are clear. The visualized skeletal structures are unremarkable.  IMPRESSION: No active cardiopulmonary disease.   Electronically Signed   By: Elige Ko   On: 03/10/2013 15:54    EKG Interpretation   None       Date: 03/10/2013  Rate: 77  Rhythm: normal sinus rhythm  QRS Axis: normal  Intervals: PR prolonged  ST/T Wave abnormalities: nonspecific T wave changes  Conduction Disutrbances:first-degree A-V block   Narrative Interpretation: sinus rhythm, 1st degree AV block, non-specific t wave changes in lateral leads  Old EKG Reviewed: none available    MDM   1. Viral syndrome    3:32 PM 48 y.o. male who presents with multiple complaints which started 3 days ago. He complains of headache, chest wall pain, epigastric pain, nasal congestion, cough, and subjective fever. Patient is afebrile and vital signs are unremarkable here. He states that he has a history of a GI ulcer. He has a history of hypertension but denies any other risk factors for cardiac disease. Likely a viral syndrome, but will get screening labs. Will treat with migraine cocktail and GI cocktail.  4:59 PM Pt now stating STD concern d/t  a cheating ex girlfriend. He denies GU sx, but would like empiric tx. Does not want a genital exam. Will tx empirically.   5:53 PM: HA mildly better. Labs non-contrib. I have discussed the diagnosis/risks/treatment options with the patient and believe the pt to be eligible for discharge home to follow-up with pcp as needed. We also discussed returning to the ED immediately if new or worsening sx occur. We discussed the sx which are most concerning (e.g., worsening HA, fever, worsening cp, sob) that necessitate immediate return. Medications administered to the patient during their visit and any new prescriptions provided to the patient are listed below.  Medications given during this visit Medications  sodium chloride 0.9 % bolus 1,000 mL (1,000 mLs Intravenous New Bag/Given 03/10/13 1609)  metoCLOPramide (REGLAN) injection 5 mg (5 mg Intravenous Given 03/10/13 0500)  diphenhydrAMINE (BENADRYL) injection 25 mg (25 mg Intravenous Given 03/10/13 1610)  gi cocktail (Maalox,Lidocaine,Donnatal) (30 mLs Oral Given 03/10/13 1610)  cefTRIAXone (ROCEPHIN) injection 250 mg (250 mg Intramuscular Given 03/10/13 1724)  metroNIDAZOLE (FLAGYL) tablet 2,000 mg (2,000 mg Oral Given 03/10/13 1724)  azithromycin (ZITHROMAX) tablet 1,000 mg (1,000 mg Oral Given 03/10/13 1724)  lidocaine (PF) (XYLOCAINE) 1 % injection (5 mLs  Given 03/10/13 1725)      Junius ArgyleForrest S Mikia Delaluz, MD 03/11/13 1701

## 2013-03-10 NOTE — ED Notes (Signed)
MD at bedside. 

## 2013-03-10 NOTE — Discharge Instructions (Signed)

## 2013-03-10 NOTE — ED Notes (Signed)
Chest tightness x 3 days. Hx runny nose, abdominal cramps and vomiting blood.

## 2013-03-10 NOTE — ED Notes (Signed)
D/c home with ride- No new rx given

## 2013-03-10 NOTE — ED Notes (Addendum)
Pt. In no distress with EDP at bedside of Pt. Asking him questions about headache and chest pain.  Pt. Speaking full concise sentences with no resp. Distress noted

## 2013-12-06 ENCOUNTER — Emergency Department (HOSPITAL_BASED_OUTPATIENT_CLINIC_OR_DEPARTMENT_OTHER): Payer: Medicaid Other

## 2013-12-06 ENCOUNTER — Encounter (HOSPITAL_BASED_OUTPATIENT_CLINIC_OR_DEPARTMENT_OTHER): Payer: Self-pay | Admitting: Emergency Medicine

## 2013-12-06 ENCOUNTER — Emergency Department (HOSPITAL_BASED_OUTPATIENT_CLINIC_OR_DEPARTMENT_OTHER)
Admission: EM | Admit: 2013-12-06 | Discharge: 2013-12-06 | Disposition: A | Discharge status: Home / Self Care | Payer: Medicaid Other | Attending: Emergency Medicine | Admitting: Emergency Medicine

## 2013-12-06 DIAGNOSIS — S161XXA Strain of muscle, fascia and tendon at neck level, initial encounter: Secondary | ICD-10-CM | POA: Diagnosis not present

## 2013-12-06 DIAGNOSIS — Z872 Personal history of diseases of the skin and subcutaneous tissue: Secondary | ICD-10-CM | POA: Insufficient documentation

## 2013-12-06 DIAGNOSIS — S199XXA Unspecified injury of neck, initial encounter: Secondary | ICD-10-CM | POA: Diagnosis present

## 2013-12-06 DIAGNOSIS — Y92410 Unspecified street and highway as the place of occurrence of the external cause: Secondary | ICD-10-CM | POA: Diagnosis not present

## 2013-12-06 DIAGNOSIS — Z79899 Other long term (current) drug therapy: Secondary | ICD-10-CM | POA: Diagnosis not present

## 2013-12-06 DIAGNOSIS — G8929 Other chronic pain: Secondary | ICD-10-CM | POA: Insufficient documentation

## 2013-12-06 DIAGNOSIS — Y9389 Activity, other specified: Secondary | ICD-10-CM | POA: Insufficient documentation

## 2013-12-06 DIAGNOSIS — Z9889 Other specified postprocedural states: Secondary | ICD-10-CM | POA: Insufficient documentation

## 2013-12-06 DIAGNOSIS — S39012A Strain of muscle, fascia and tendon of lower back, initial encounter: Secondary | ICD-10-CM | POA: Diagnosis not present

## 2013-12-06 DIAGNOSIS — K219 Gastro-esophageal reflux disease without esophagitis: Secondary | ICD-10-CM | POA: Insufficient documentation

## 2013-12-06 DIAGNOSIS — M542 Cervicalgia: Secondary | ICD-10-CM

## 2013-12-06 MED ORDER — TRAMADOL HCL 50 MG PO TABS: 50.0000 mg | ORAL_TABLET | Freq: Four times a day (QID) | ORAL | Status: DC | PRN

## 2013-12-06 MED ORDER — DIAZEPAM 5 MG PO TABS: 5.0000 mg | ORAL_TABLET | Freq: Two times a day (BID) | ORAL | Status: DC

## 2013-12-06 MED ORDER — KETOROLAC TROMETHAMINE 60 MG/2ML IM SOLN
60.0000 mg | Freq: Once | INTRAMUSCULAR | Status: AC
  Administered 2013-12-06: 60 mg via INTRAMUSCULAR
  Filled 2013-12-06: qty 2
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
## 2013-12-07 NOTE — ED Provider Notes (Signed)
Medical screening examination/treatment/procedure(s) were performed by non-physician practitioner and as supervising physician I was immediately available for consultation/collaboration.   EKG Interpretation None        Marqui Formby, MD 12/07/13 1433 

## 2014-02-17 ENCOUNTER — Encounter (HOSPITAL_BASED_OUTPATIENT_CLINIC_OR_DEPARTMENT_OTHER): Payer: Self-pay

## 2014-02-17 ENCOUNTER — Emergency Department (HOSPITAL_BASED_OUTPATIENT_CLINIC_OR_DEPARTMENT_OTHER)
Admission: EM | Admit: 2014-02-17 | Discharge: 2014-02-17 | Discharge status: Against Medical Advice | Payer: Medicaid Other | Attending: Emergency Medicine | Admitting: Emergency Medicine

## 2014-02-17 DIAGNOSIS — S3992XA Unspecified injury of lower back, initial encounter: Secondary | ICD-10-CM | POA: Insufficient documentation

## 2014-02-17 DIAGNOSIS — Y9389 Activity, other specified: Secondary | ICD-10-CM | POA: Diagnosis not present

## 2014-02-17 DIAGNOSIS — Y998 Other external cause status: Secondary | ICD-10-CM | POA: Insufficient documentation

## 2014-02-17 DIAGNOSIS — S79911A Unspecified injury of right hip, initial encounter: Secondary | ICD-10-CM | POA: Diagnosis not present

## 2014-02-17 DIAGNOSIS — Y92410 Unspecified street and highway as the place of occurrence of the external cause: Secondary | ICD-10-CM | POA: Diagnosis not present

## 2014-02-17 DIAGNOSIS — S79912A Unspecified injury of left hip, initial encounter: Secondary | ICD-10-CM | POA: Diagnosis present

## 2014-02-17 NOTE — ED Notes (Signed)
Pt called x1, no answer 
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
## 2014-09-19 ENCOUNTER — Encounter (HOSPITAL_COMMUNITY): Payer: Self-pay | Admitting: *Deleted

## 2014-09-19 ENCOUNTER — Emergency Department (HOSPITAL_COMMUNITY)
Admission: EM | Admit: 2014-09-19 | Discharge: 2014-09-19 | Disposition: A | Discharge status: Home / Self Care | Payer: Medicaid Other | Attending: Emergency Medicine | Admitting: Emergency Medicine

## 2014-09-19 DIAGNOSIS — K219 Gastro-esophageal reflux disease without esophagitis: Secondary | ICD-10-CM | POA: Diagnosis not present

## 2014-09-19 DIAGNOSIS — Y9389 Activity, other specified: Secondary | ICD-10-CM | POA: Diagnosis not present

## 2014-09-19 DIAGNOSIS — Y9289 Other specified places as the place of occurrence of the external cause: Secondary | ICD-10-CM | POA: Insufficient documentation

## 2014-09-19 DIAGNOSIS — S80861A Insect bite (nonvenomous), right lower leg, initial encounter: Secondary | ICD-10-CM | POA: Diagnosis not present

## 2014-09-19 DIAGNOSIS — W57XXXA Bitten or stung by nonvenomous insect and other nonvenomous arthropods, initial encounter: Secondary | ICD-10-CM | POA: Diagnosis not present

## 2014-09-19 DIAGNOSIS — G8929 Other chronic pain: Secondary | ICD-10-CM | POA: Diagnosis not present

## 2014-09-19 DIAGNOSIS — S80862A Insect bite (nonvenomous), left lower leg, initial encounter: Secondary | ICD-10-CM | POA: Insufficient documentation

## 2014-09-19 DIAGNOSIS — Y998 Other external cause status: Secondary | ICD-10-CM | POA: Diagnosis not present

## 2014-09-19 DIAGNOSIS — S40862A Insect bite (nonvenomous) of left upper arm, initial encounter: Secondary | ICD-10-CM | POA: Diagnosis not present

## 2014-09-19 DIAGNOSIS — Z872 Personal history of diseases of the skin and subcutaneous tissue: Secondary | ICD-10-CM | POA: Diagnosis not present

## 2014-09-19 DIAGNOSIS — Z79899 Other long term (current) drug therapy: Secondary | ICD-10-CM | POA: Diagnosis not present

## 2014-09-19 DIAGNOSIS — S40861A Insect bite (nonvenomous) of right upper arm, initial encounter: Secondary | ICD-10-CM | POA: Diagnosis not present

## 2014-09-19 MED ORDER — HYDROXYZINE HCL 25 MG PO TABS
25.0000 mg | ORAL_TABLET | Freq: Once | ORAL | Status: AC
  Administered 2014-09-19: 25 mg via ORAL
  Filled 2014-09-19: qty 1

## 2014-09-19 MED ORDER — METHYLPREDNISOLONE SODIUM SUCC 125 MG IJ SOLR
125.0000 mg | Freq: Once | INTRAMUSCULAR | Status: AC
  Administered 2014-09-19: 125 mg via INTRAMUSCULAR
  Filled 2014-09-19: qty 2

## 2014-09-19 MED ORDER — METHYLPREDNISOLONE 4 MG PO TBPK: ORAL_TABLET | ORAL | Status: DC

## 2014-09-19 MED ORDER — HYDROXYZINE HCL 25 MG PO TABS: 25.0000 mg | ORAL_TABLET | Freq: Four times a day (QID) | ORAL | Status: DC | PRN

## 2014-09-19 NOTE — ED Provider Notes (Addendum)
CSN: [id number]     Arrival date & time 09/19/14  0130 History   First MD Initiated Contact with Patient 09/19/14 0421     Chief Complaint  Patient presents with  . Insect Bites      (Consider location/radiation/quality/duration/timing/severity/associated sxs/prior Treatment) HPI  This is a 49 year old male who was helping a friend outside the evening before yesterday. He was bitten multiple times by wet he thought were mosquitoes. He is subsequently developed tender, erythematous, raised plaques at the bite sites, which are located primarily on his extremities. He states they're "very painful" and he is having severe generalized pruritus. He is taking Benadryl without relief. He had some nausea, vomiting and diarrhea yesterday as well as a fever. His GI symptoms of subsequently improved. He is not having shortness of breath or throat swelling. 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  Past Medical History  Diagnosis Date  . Back pain, chronic   . Reflux   . Ulcer disease    Past Surgical History  Procedure Laterality Date  . Back surgery    . Total knee arthroplasty    . Anterior cruciate ligament repair      bilateral knees   No family history on file. History  Substance Use Topics  . Smoking status: Never Smoker   . Smokeless tobacco: Not on file  . Alcohol Use: Yes     Comment: occ    Review of Systems  All other systems reviewed and are negative.   Allergies  Oxycodone  Home Medications   Prior to Admission medications   Medication Sig Start Date End Date Taking? Authorizing Provider  cyclobenzaprine (FLEXERIL) 5 MG tablet Take 10 mg by mouth 3 (three) times daily as needed for muscle spasms.    Historical Provider, MD  esomeprazole (NEXIUM) 20 MG capsule Take 20 mg by mouth daily before breakfast.    Historical Provider, MD   BP 118/69 mmHg  Pulse 50  Temp(Src) 97.7 F (36.5 C) (Oral)  Resp 14  SpO2 100%   Physical Exam   General: Well-developed, well-nourished male in no acute distress; appearance consistent with age of record HENT: normocephalic; atraumatic Eyes: pupils equal, round and reactive to light; extraocular muscles intact Neck: supple Heart: regular rate and rhythm Lungs: clear to auscultation bilaterally Abdomen: soft; nondistended; nontender; bowel sounds present Extremities: No deformity; full range of motion; pulses normal Neurologic: Awake, alert and oriented; motor function intact in all extremities and symmetric; no facial droop Skin: Warm and dry; scattered tender, mildly erythematous plaques on extremities:  Two plaques of left lateral knee.  Psychiatric: Normal mood and affect    ED Course  Procedures (including critical care time)   MDM  Will treat with steroids and hydroxyzine.     Paula Libra, MD 09/19/14 [id number]  Paula Libra, MD 09/19/14 [phone]

## 2014-09-19 NOTE — ED Notes (Signed)
Pt states that he was bitten by mosquitos yesterday and that he is having increased itching and swelling at the bite sites today; pt states that he took Benadryl without relief; pt reports allergy to bees - no signs orsymptoms of allergic reaction noted

## 2015-01-05 ENCOUNTER — Observation Stay (HOSPITAL_BASED_OUTPATIENT_CLINIC_OR_DEPARTMENT_OTHER)
Admission: EM | Admit: 2015-01-05 | Discharge: 2015-01-06 | Disposition: A | Discharge status: Home / Self Care | Payer: Medicaid Other | Attending: Internal Medicine | Admitting: Internal Medicine

## 2015-01-05 ENCOUNTER — Encounter (HOSPITAL_BASED_OUTPATIENT_CLINIC_OR_DEPARTMENT_OTHER): Payer: Self-pay | Admitting: *Deleted

## 2015-01-05 ENCOUNTER — Emergency Department (HOSPITAL_COMMUNITY): Payer: Medicaid Other

## 2015-01-05 DIAGNOSIS — M545 Low back pain: Secondary | ICD-10-CM | POA: Diagnosis not present

## 2015-01-05 DIAGNOSIS — M5441 Lumbago with sciatica, right side: Secondary | ICD-10-CM | POA: Diagnosis not present

## 2015-01-05 DIAGNOSIS — R200 Anesthesia of skin: Secondary | ICD-10-CM

## 2015-01-05 DIAGNOSIS — M961 Postlaminectomy syndrome, not elsewhere classified: Secondary | ICD-10-CM | POA: Insufficient documentation

## 2015-01-05 DIAGNOSIS — I10 Essential (primary) hypertension: Secondary | ICD-10-CM | POA: Insufficient documentation

## 2015-01-05 DIAGNOSIS — R2990 Unspecified symptoms and signs involving the nervous system: Secondary | ICD-10-CM

## 2015-01-05 DIAGNOSIS — K219 Gastro-esophageal reflux disease without esophagitis: Secondary | ICD-10-CM

## 2015-01-05 DIAGNOSIS — M5442 Lumbago with sciatica, left side: Secondary | ICD-10-CM | POA: Diagnosis not present

## 2015-01-05 DIAGNOSIS — M79601 Pain in right arm: Secondary | ICD-10-CM

## 2015-01-05 DIAGNOSIS — G894 Chronic pain syndrome: Secondary | ICD-10-CM | POA: Diagnosis not present

## 2015-01-05 DIAGNOSIS — R292 Abnormal reflex: Secondary | ICD-10-CM

## 2015-01-05 DIAGNOSIS — Z79899 Other long term (current) drug therapy: Secondary | ICD-10-CM | POA: Diagnosis not present

## 2015-01-05 DIAGNOSIS — Z981 Arthrodesis status: Secondary | ICD-10-CM | POA: Diagnosis not present

## 2015-01-05 DIAGNOSIS — G9619 Other disorders of meninges, not elsewhere classified: Secondary | ICD-10-CM | POA: Insufficient documentation

## 2015-01-05 DIAGNOSIS — G8929 Other chronic pain: Secondary | ICD-10-CM

## 2015-01-05 DIAGNOSIS — M549 Dorsalgia, unspecified: Secondary | ICD-10-CM | POA: Diagnosis present

## 2015-01-05 HISTORY — DX: Localized swelling, mass and lump, unspecified: R22.9

## 2015-01-05 HISTORY — DX: Reserved for concepts with insufficient information to code with codable children: IMO0002

## 2015-01-05 LAB — URINALYSIS, ROUTINE W REFLEX MICROSCOPIC
Bilirubin Urine: NEGATIVE
GLUCOSE, UA: NEGATIVE mg/dL
Hgb urine dipstick: NEGATIVE
Ketones, ur: 15 mg/dL — AB
LEUKOCYTES UA: NEGATIVE
Nitrite: NEGATIVE
PROTEIN: NEGATIVE mg/dL
SPECIFIC GRAVITY, URINE: 1.017 (ref 1.005–1.030)
pH: 5 (ref 5.0–8.0)

## 2015-01-05 LAB — BASIC METABOLIC PANEL
Anion gap: 7 (ref 5–15)
BUN: 13 mg/dL (ref 6–20)
CHLORIDE: 108 mmol/L (ref 101–111)
CO2: 23 mmol/L (ref 22–32)
Calcium: 9.4 mg/dL (ref 8.9–10.3)
Creatinine, Ser: 0.94 mg/dL (ref 0.61–1.24)
GFR calc Af Amer: >60 mL/min (ref >60)
GFR calc non Af Amer: >60 mL/min (ref >60)
GLUCOSE: 106 mg/dL — AB (ref 65–99)
POTASSIUM: 3.9 mmol/L (ref 3.5–5.1)
Sodium: 138 mmol/L (ref 135–145)

## 2015-01-05 LAB — CBC WITH DIFFERENTIAL/PLATELET
Basophils Absolute: 0 10*3/uL (ref 0.0–0.1)
Basophils Relative: 0 %
Eosinophils Absolute: 0.1 10*3/uL (ref 0.0–0.7)
Eosinophils Relative: 1 %
HCT: 42.2 % (ref 39.0–52.0)
HEMOGLOBIN: 14.2 g/dL (ref 13.0–17.0)
LYMPHS ABS: 1.8 10*3/uL (ref 0.7–4.0)
LYMPHS PCT: 36 %
MCH: 29.2 pg (ref 26.0–34.0)
MCHC: 33.6 g/dL (ref 30.0–36.0)
MCV: 86.8 fL (ref 78.0–100.0)
MONOS PCT: 7 %
Monocytes Absolute: 0.4 10*3/uL (ref 0.1–1.0)
Neutro Abs: 2.8 10*3/uL (ref 1.7–7.7)
Neutrophils Relative %: 56 %
Platelets: 272 10*3/uL (ref 150–400)
RBC: 4.86 MIL/uL (ref 4.22–5.81)
RDW: 13.9 % (ref 11.5–15.5)
WBC: 5 10*3/uL (ref 4.0–10.5)

## 2015-01-05 LAB — CBC
HCT: 39 % (ref 39.0–52.0)
HEMOGLOBIN: 12.9 g/dL — AB (ref 13.0–17.0)
MCH: 29.5 pg (ref 26.0–34.0)
MCHC: 33.1 g/dL (ref 30.0–36.0)
MCV: 89 fL (ref 78.0–100.0)
PLATELETS: 235 10*3/uL (ref 150–400)
RBC: 4.38 MIL/uL (ref 4.22–5.81)
RDW: 14.1 % (ref 11.5–15.5)
WBC: 5.7 10*3/uL (ref 4.0–10.5)

## 2015-01-05 LAB — CREATININE, SERUM
Creatinine, Ser: 1.03 mg/dL (ref 0.61–1.24)
GFR calc Af Amer: >60 mL/min (ref >60)
GFR calc non Af Amer: >60 mL/min (ref >60)

## 2015-01-05 LAB — CBG MONITORING, ED: Glucose-Capillary: 81 mg/dL (ref 65–99)

## 2015-01-05 MED ORDER — HYDROMORPHONE HCL 1 MG/ML IJ SOLN
0.5000 mg | Freq: Once | INTRAMUSCULAR | Status: AC
  Administered 2015-01-05: 0.5 mg via INTRAVENOUS
  Filled 2015-01-05: qty 1

## 2015-01-05 MED ORDER — HYDROMORPHONE HCL 1 MG/ML IJ SOLN
1.0000 mg | INTRAMUSCULAR | Status: DC | PRN
  Administered 2015-01-05 (×3): 1 mg via INTRAVENOUS
  Filled 2015-01-05 (×3): qty 1

## 2015-01-05 MED ORDER — ONDANSETRON HCL 4 MG/2ML IJ SOLN
4.0000 mg | Freq: Once | INTRAMUSCULAR | Status: AC
  Administered 2015-01-05: 4 mg via INTRAVENOUS
  Filled 2015-01-05: qty 2

## 2015-01-05 MED ORDER — GABAPENTIN 300 MG PO CAPS
300.0000 mg | ORAL_CAPSULE | Freq: Every day | ORAL | Status: DC
  Administered 2015-01-05: 300 mg via ORAL
  Filled 2015-01-05: qty 1

## 2015-01-05 MED ORDER — SODIUM CHLORIDE 0.9 % IV SOLN
INTRAVENOUS | Status: DC
  Administered 2015-01-05: 14:00:00 via INTRAVENOUS

## 2015-01-05 MED ORDER — HYDROMORPHONE HCL 1 MG/ML IJ SOLN
1.0000 mg | Freq: Once | INTRAMUSCULAR | Status: AC
  Administered 2015-01-05: 1 mg via INTRAVENOUS
  Filled 2015-01-05: qty 1

## 2015-01-05 MED ORDER — IBUPROFEN 200 MG PO TABS
600.0000 mg | ORAL_TABLET | Freq: Four times a day (QID) | ORAL | Status: DC | PRN
  Administered 2015-01-05 – 2015-01-06 (×3): 600 mg via ORAL
  Filled 2015-01-05 (×3): qty 3

## 2015-01-05 MED ORDER — PANTOPRAZOLE SODIUM 40 MG PO TBEC
40.0000 mg | DELAYED_RELEASE_TABLET | Freq: Every day | ORAL | Status: DC
  Administered 2015-01-06: 40 mg via ORAL
  Filled 2015-01-05 (×2): qty 1

## 2015-01-05 MED ORDER — FENTANYL CITRATE (PF) 100 MCG/2ML IJ SOLN
100.0000 ug | Freq: Once | INTRAMUSCULAR | Status: AC
  Administered 2015-01-05: 100 ug via INTRAVENOUS
  Filled 2015-01-05: qty 2

## 2015-01-05 MED ORDER — ACETAMINOPHEN 500 MG PO TABS
1000.0000 mg | ORAL_TABLET | Freq: Three times a day (TID) | ORAL | Status: DC
  Administered 2015-01-05 – 2015-01-06 (×3): 1000 mg via ORAL
  Filled 2015-01-05 (×3): qty 2

## 2015-01-05 MED ORDER — CYCLOBENZAPRINE HCL 10 MG PO TABS
10.0000 mg | ORAL_TABLET | Freq: Three times a day (TID) | ORAL | Status: DC | PRN
  Administered 2015-01-05 – 2015-01-06 (×3): 10 mg via ORAL
  Filled 2015-01-05 (×4): qty 1

## 2015-01-05 MED ORDER — KETOROLAC TROMETHAMINE 30 MG/ML IJ SOLN
30.0000 mg | Freq: Once | INTRAMUSCULAR | Status: AC
  Administered 2015-01-05: 30 mg via INTRAVENOUS
  Filled 2015-01-05: qty 1

## 2015-01-05 MED ORDER — HYDROMORPHONE HCL 1 MG/ML IJ SOLN
1.0000 mg | Freq: Once | INTRAMUSCULAR | Status: DC | PRN
  Filled 2015-01-05: qty 1

## 2015-01-05 MED ORDER — ENOXAPARIN SODIUM 40 MG/0.4ML ~~LOC~~ SOLN
40.0000 mg | SUBCUTANEOUS | Status: DC
  Administered 2015-01-05: 40 mg via SUBCUTANEOUS
  Filled 2015-01-05: qty 0.4

## 2015-01-05 NOTE — ED Notes (Signed)
PT voided 600cc urine in urinal. Bladder scan shows no post void residual.
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
## 2015-01-05 NOTE — ED Notes (Signed)
[facility]

## 2015-01-05 NOTE — ED Notes (Signed)
PT neuro status unchanged.

## 2015-01-05 NOTE — ED Notes (Signed)
[facility] given to [facility]Kelly RN [facility].

## 2015-01-05 NOTE — Progress Notes (Signed)
Pt arrived to room 5M03.  Pt is alert and oriented.  Pt states he has back pain with numbness in both hands and down the right leg.  Pt stated his jaw is locking and making popping noises when he tried to eat.  MD notified.  Will continue to monitor.    Estanislado EmmsAshley Schwarz, RN

## 2015-01-05 NOTE — ED Provider Notes (Signed)
CSN: [id number]     Arrival date & time 01/05/15  0902 History   First MD Initiated Contact with Patient 01/05/15 [phone]     Chief Complaint  Patient presents with  . Back Pain     (Consider location/radiation/quality/duration/timing/severity/associated sxs/prior Treatment) Patient is a 49 y.o. male presenting with back pain.  Back Pain Location:  Lumbar spine Quality:  Aching and cramping Radiates to:  L foot and R foot Pain severity:  Severe Pain is:  Same all the time Onset quality:  Gradual Duration:  24 months Timing:  Constant Progression:  Worsening Chronicity:  Chronic Context: not falling   Relieved by:  Nothing Worsened by:  Bending, bowel movement, touching and twisting Ineffective treatments:  None tried Associated symptoms: no abdominal pain, no chest pain, no fever and no headaches     49 yo M with a chief complaint of low back pain. Is been going on for many years. Patient has [facility] as well as [facility] this. Both felt that the surgery carried a 33% mortality and so was deferred. 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  Patient feels that over the past 3 days he's had sudden numbness to his lower back that goes down the outside of his legs. Is a new neurologic finding for him. He also has been unable to have a bowel movement secondary to the pain. States that he is also lost his perirectal sensation. Denies loss of bowel denies difficulty with urination. Patient is still able to ambulate without difficulty.  Also now having right upper extremity spasm in pain. Having tingling that goes up his arm. Denies recent injury. Denies fevers or chills.  Past Medical History  Diagnosis Date  . Back pain, chronic   . Reflux   . Ulcer disease   . 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    Past Surgical History  Procedure Laterality Date  . Back surgery    . Total knee arthroplasty    . Anterior cruciate ligament repair     bilateral knees   No family history on file. Social History  Substance Use Topics  . Smoking status: Never Smoker   . Smokeless tobacco: None  . Alcohol Use: Yes     Comment: occ    Review of Systems  Constitutional: Negative for fever and chills.  HENT: Negative for congestion and facial swelling.   Eyes: Negative for discharge and visual disturbance.  Respiratory: Negative for shortness of breath.   Cardiovascular: Negative for chest pain and palpitations.  Gastrointestinal: Negative for vomiting, abdominal pain and diarrhea.  Musculoskeletal: Positive for myalgias, back pain and arthralgias.  Skin: Negative for color change and rash.  Neurological: Negative for tremors, syncope and headaches.  Psychiatric/Behavioral: Negative for confusion and dysphoric mood.      Allergies  Oxycodone  Home Medications   Prior to Admission medications   Medication Sig Start Date End Date Taking? Authorizing Provider  cyclobenzaprine (FLEXERIL) 5 MG tablet Take 10 mg by mouth 3 (three) times daily as needed for muscle spasms.   Yes Historical Provider, MD  esomeprazole (NEXIUM) 20 MG capsule Take 20 mg by mouth daily before breakfast.   Yes Historical Provider, MD  GABAPENTIN PO Take by mouth.   Yes Historical Provider, MD  hydrOXYzine (ATARAX/VISTARIL) 25 MG tablet Take 1-2 tablets (25-50 mg total) by mouth every 6 (six) hours as needed for itching (may  cause drowsiness). 09/19/14   Paula Libra, MD  methylPREDNISolone (MEDROL DOSEPAK) 4 MG TBPK tablet Take tapering dose per package instructions starting Wednesday, August 10. 09/19/14   John Molpus, MD   BP 143/105 mmHg  Pulse 82  Temp(Src) 98.2 F (36.8 C) (Oral)  Resp 18  Ht  (1.905 m)  Wt 232 lb (105.235 kg)  BMI 29.00 kg/m2  SpO2 99% Physical Exam  Constitutional: He is oriented to person, place, and time. He appears well-developed and well-nourished.  HENT:  Head: Normocephalic and atraumatic.  Eyes: EOM are normal. Pupils  are equal, round, and reactive to light.  Neck: Normal range of motion. Neck supple. No JVD present.  Cardiovascular: Normal rate and regular rhythm.  Exam reveals no gallop and no friction rub.   No murmur heard. Pulmonary/Chest: No respiratory distress. He has no wheezes.  Abdominal: He exhibits no distension. There is no rebound and no guarding.  Genitourinary:  Good rectal tone  Musculoskeletal: Normal range of motion.  Spasm to the right triceps muscle. Hyperreflexia to the triceps tendon as well as the trachea cephalic. No noted clonus subjective decreased sensation and motor intact. Pulse motor and sensation intact to bilateral lower extremities with the exception of the lateral aspect of the foot bilaterally.  No clonus or hyperreflexia and bilateral lower extremities  Neurological: He is alert and oriented to person, place, and time.  Skin: No rash noted. No pallor.  Psychiatric: He has a normal mood and affect. His behavior is normal.  Nursing note and vitals reviewed.   ED Course  Procedures (including critical care time) Labs Review Labs Reviewed  URINALYSIS, ROUTINE W REFLEX MICROSCOPIC (NOT AT [facility])  CBC WITH DIFFERENTIAL/PLATELET  BASIC METABOLIC PANEL    Imaging Review No results found. I have personally reviewed and evaluated these images and lab results as part of my medical decision-making.   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 Feel that this needs imaging with MRI to fully evaluate the extent as well as patient also has new neurologic findings in his right upper extremity. Discussed the case with Dr. Lynelle Doctor at [facility] will accept the patient in ED to ED transfer.  The patients  results and plan were reviewed and discussed.   Any x-rays performed were independently reviewed by myself.   Differential diagnosis were considered with the presenting HPI.  Medications  ketorolac (TORADOL) 30 MG/ML injection 30 mg (30 mg Intravenous Given 01/05/15 1013)  HYDROmorphone (DILAUDID) injection 0.5 mg (0.5 mg Intravenous Given 01/05/15 1013)  ondansetron (ZOFRAN) injection 4 mg (4 mg Intravenous Given 01/05/15 1013)    Filed Vitals:   01/05/15 0906  BP: 143/105  Pulse: 82  Temp: 98.2 F (36.8 C)  TempSrc: Oral  Resp: 18  Height:  (1.905 m)  Weight: 232 lb (105.235 kg)  SpO2: 99%    Final diagnoses:  Numbness in both legs  Bilateral low back pain with sciatica, sciatica laterality unspecified  Right arm pain  Hyperreflexia    Transferr was discussed with the accepting physician, patient and/or family and they are comfortable with the plan.     Melene Plan, DO 01/05/15 1018

## 2015-01-05 NOTE — ED Provider Notes (Signed)
Care assumed by Marlon Peliffany Greene, PA-C at 3:45 pm. Please see her note for initial HPI, ROS, PE and initial workup.  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 [facility]. He has numbness to low back and bilateral legs with constipation and perianal sensory deficits. Also reports RUE pain and spasms.  Pain meds provided. MR C/T/L spine ordered. Results pending at handoff. If scan is unremarkable, plan is to call neurology.  No acute findings in C or T spine. Unable to tolerate remainder of imaging due to pain. Need lower spine imaging to determine etiology of symptoms. Will attempt admission for pain control with attempt to complete imaging at some later point. More pain meds given.   Disposition: Admit to Medicine for pain control.  Discussed with Dr. Clayborne DanaMesner.   Maris BergerJonah Harding Thomure, MD 01/05/15 2350  Marily MemosJason Mesner, MD 01/06/15 [phone]

## 2015-01-05 NOTE — ED Notes (Signed)
MD at bedside. 

## 2015-01-05 NOTE — ED Notes (Signed)
Patient transported to MRI 

## 2015-01-05 NOTE — H&P (Signed)
History and Physical  Patient Name: Evan Kim     WGN:[id number]    DOB: [birth year]    DOA: 01/05/2015 Referring physician: Maris Berger, MD PCP:  Dr. Carlis Abbott and Dr. Sharee Pimple, per patient, Care[facility]hwere shows appointments with [facility]     Chief Complaint: Acute on chronic back pain  HPI: Evan Kim is a 49 y.o. male with a past medical history significant for post-lumbar laminectomy chronic pain syndrome, known sacral meningoceles and GERD who presents with acute on chronic back pain.  The patient was in his usual state of health, meaning chronic severe daily pain, but still able to drive, visit his daughter at school for lunch, and do some work around the house, until yesterday when the family drove to [place] for Thanksgiving when the patient had "severe, stabbing sharp" pain in the low back while riding in the car.   This pain was so severe he came to the ER today for evaluation. He initially described to the ER physicians that he was having difficulty walking, and had saddle anesthesia and difficulty with bowel movements, and so MRI to rule out cauda equina syndrome was ordered. Unfortunately MRI of the C-spine and thoracic spine were ordered as well and unwisely the lumbar spine was left to last, with the result that the patient was unable to tolerate laying on the table for the entire duration of the study, which had to be aborted. TRH were asked to admit for pain control and completion of the patient's MRI to rule out cauda equina syndrome.   The patient has chronic pain located in the legs, low back, and recently right dorsum of forearm and left anterior chest, which he describes as burning, electric, tingling, but also sharp, aching, and throbbing.  They are accompanied by numbness on the right lateral leg, left buttock, and dorsal hand.  He is managed by his PCP, at [facility], and takes acetaminophen only.  He was recently prescribed gabapentin.  He does not take  Celexa because "it didn't make my pain better".  Of note, the patient has a complicated history of back pain.  Summarized briefly from the patient's report and Care Everywhere is briefly as follows: he had chronic intermittent back pain until around 2011 when he had worsening low back pain and radicular-type pain/numbness as well as fecal and urinary incontinence for a year and then had a lumbar fusion at L5-S1 by Dr. Alvester Morin of [facility] in 2012 (perhaps for a pars defect, records show).  Afterwards, his pain, numbness and incontinence continued without change, and his records at [facility] are a saga of difficult to treat chronic pain, recurrent pain flares, and evaluations by Orthopedics, Neurosurgery and Pain Medicine.  Of note, at the patient's laminectomy in 2012, he was noted to have some small sacral meningoceles (the largest of these is 3.2 cm, although the patient states the largest is "soft-ball sized"), which have been evaluated by NSG at [facility] and recommended to not need surgery.  The patient patient also claims that these were evaluated at [facility], but I see no records of this.         Review of Systems:  Pt complains of headache, confusion, fever, chills, cold symptoms, cough, shortness of breath, chest pain, vomiting, vomiting blood for months, diarrhea, abdominal pain, arm swelling, joint pains, rashes, difficulty urinating, difficulty having a bowel movement, perineal numbness, difficulty walking.  Alarm symptoms like vomiting blood or inability to walk have not been witnessed by his partner  who is present in the room or are contradicted by his other history or exam.    Allergies:  Allergies  Allergen Reactions  . Bee Venom Anaphylaxis  . Oxycodone Hives    Home medications: 1. Acetaminophen 2. Cyclobenzaprine 3. Esomeprazole 20 mg daily He was recently prescribed gabapentin, with schedule to titrate up, but has not started. He does not take prescribed citalopram.  Past medical  history: 1. 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 Sacral meningoceles, known 3. GERD  Past surgical history: 1. Lumbar laminectomy 2. R TKR  Family history:  Mother, migraines. Maternal grandmother, heart attack. Brothers, osteoarthritis.   Social History:  Patient lives with his parents and daughter.  He does not work and is on disability.  He is able to walk without a walker and drive.  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  He does not smoke.        Physical Exam: BP 153/103 mmHg  Pulse 63  Temp(Src) 98.2 F (36.8 C) (Oral)  Resp 14  Ht 6\' 3"  (1.905 m)  Wt 105.235 kg (232 lb)  BMI 29.00 kg/m2  SpO2 100% General appearance: Well-developed, adult male, alert and in moderate distress from pain, tired.   Eyes: Anicteric, conjunctiva pink, lids and lashes normal.     ENT: No nasal deformity, discharge, or epistaxis.  OP moist without lesions.   Lymph: No cervical, supraclavicular lymphadenopathy. Skin: Warm and dry.  No suspicious rashes or lesions where the patient points on his back, abdomen, or legs. Cardiac: RRR, nl S1-S2, no murmurs appreciated.  Capillary refill is brisk.  No LE edema.  Radial pulses 2+ and symmetric. Respiratory: Normal respiratory rate and rhythm.  CTAB without rales or wheezes. Abdomen: Abdomen soft without rigidity.  Mild nonfocal TTP. No ascites, distension.   MSK: No deformities or effusions. Neuro: Reported deficits to light touch in right lateral leg, dorsum of right hand/forearm, left buttock and perineum.  Anal wink IS present.  Rectal tone normal.  Sensorium intact and responding to questions, attention normal.  Speech is fluent.  Moves all extremities equally and with normal coordination.   Able to stand and walk slowly.  Appears unsteady with tandem gait.  Wobbles with eyes closed and feet together.  Patellar reflexes 2+ and symmetric. BR reflexes 1+ and symmetric. Psych: Behavior appropriate.  Affect  blunted and sad.  No evidence of aural or visual hallucinations.   Has delusions versus exaggerated understanding of size of meningoceles and rash.    Labs on Admission:  The metabolic panel shows normal sodium, potassium, bicarbonate, and renal function. The complete blood count shows normal white blood cell count, no anemia, no thrombocytopenia. Urinalysis with minimal ketones. CBG normal.   Radiological Exams on Admission: Personally reviewed: Mr Cervical Spine Wo Contrast  01/05/2015  CLINICAL DATA:  Abnormal neurological examination. Acute numbness involving the low back and outside of both legs. Right upper extremity spasm and pain. Chronic low back pain. Unable to have a bowel movement due to pain. Loss of perirectal sensation. EXAM: MRI CERVICAL SPINE WITHOUT CONTRAST; MRI THORACIC SPINE WITHOUT CONTRAST TECHNIQUE: Multiplanar and multiecho pulse sequences of the cervical spine, to include the craniocervical junction and cervicothoracic junction, were obtained according to standard protocol without intravenous contrast.; Multiplanar and multiecho pulse sequences of the thoracic spine were obtained without intravenous contrast. COMPARISON:  None. FINDINGS: MR CERVICAL SPINE FINDINGS: There is mild straightening of the normal cervical lordosis. There is no significant listhesis. Vertebral  body heights are preserved. Mild disc space narrowing and degenerative endplate changes are present at C5-6 and C6-7. No significant vertebral marrow edema is seen. Craniocervical junction is unremarkable. Cervical spinal cord is normal in caliber and signal. The paraspinal soft tissues are unremarkable. C2-3: Mild disc bulging and asymmetric right uncovertebral hypertrophy without significant stenosis. C3-4: Mild disc bulging and bilateral uncovertebral hypertrophy without significant stenosis. C4-5: Mild disc bulging and right greater than left uncovertebral hypertrophy results in minimal right neural  foraminal narrowing without spinal stenosis. C5-6: Small right posterolateral disc extrusion with slight caudal migration, mild disc bulging, and right greater than left uncovertebral hypertrophy result in moderate right neural foraminal stenosis without spinal stenosis. C6-7: Mild disc bulging and uncovertebral hypertrophy results in mild bilateral neural foraminal stenosis without spinal stenosis. C7-T1:  Negative. MR THORACIC SPINE FINDINGS: Vertebral alignment is normal. Vertebral body heights are preserved without evidence of compression fracture. A few small Schmorl's nodes are noted in the mid to lower thoracic spine. No vertebral marrow edema is seen. The thoracic spinal cord is normal in caliber and signal. No thoracic disc herniation, spinal stenosis, or neural foraminal stenosis is seen. An 11 mm T2 hypointense nodule is noted in the left adrenal gland. IMPRESSION: 1. Mild cervical disc degeneration, most notable at C5-6 where there is moderate right neural foraminal stenosis. No cervical spinal stenosis. 2. No thoracic disc herniation or stenosis. 3. Normal appearance of the cervical and thoracic spinal cord. 4. 11 mm left adrenal nodule. Assuming the patient has no history of malignancy, this could be further characterized by abdominal CT or MRI in 12 months. Electronically Signed   By: Sebastian Ache M.D.   On: 01/05/2015 18:16   Mr Thoracic Spine Wo Contrast  01/05/2015  CLINICAL DATA:  Abnormal neurological examination. Acute numbness involving the low back and outside of both legs. Right upper extremity spasm and pain. Chronic low back pain. Unable to have a bowel movement due to pain. Loss of perirectal sensation. EXAM: MRI CERVICAL SPINE WITHOUT CONTRAST; MRI THORACIC SPINE WITHOUT CONTRAST TECHNIQUE: Multiplanar and multiecho pulse sequences of the cervical spine, to include the craniocervical junction and cervicothoracic junction, were obtained according to standard protocol without  intravenous contrast.; Multiplanar and multiecho pulse sequences of the thoracic spine were obtained without intravenous contrast. COMPARISON:  None. FINDINGS: MR CERVICAL SPINE FINDINGS: There is mild straightening of the normal cervical lordosis. There is no significant listhesis. Vertebral body heights are preserved. Mild disc space narrowing and degenerative endplate changes are present at C5-6 and C6-7. No significant vertebral marrow edema is seen. Craniocervical junction is unremarkable. Cervical spinal cord is normal in caliber and signal. The paraspinal soft tissues are unremarkable. C2-3: Mild disc bulging and asymmetric right uncovertebral hypertrophy without significant stenosis. C3-4: Mild disc bulging and bilateral uncovertebral hypertrophy without significant stenosis. C4-5: Mild disc bulging and right greater than left uncovertebral hypertrophy results in minimal right neural foraminal narrowing without spinal stenosis. C5-6: Small right posterolateral disc extrusion with slight caudal migration, mild disc bulging, and right greater than left uncovertebral hypertrophy result in moderate right neural foraminal stenosis without spinal stenosis. C6-7: Mild disc bulging and uncovertebral hypertrophy results in mild bilateral neural foraminal stenosis without spinal stenosis. C7-T1:  Negative. MR THORACIC SPINE FINDINGS: Vertebral alignment is normal. Vertebral body heights are preserved without evidence of compression fracture. A few small Schmorl's nodes are noted in the mid to lower thoracic spine. No vertebral marrow edema is seen. The thoracic spinal cord is normal  in caliber and signal. No thoracic disc herniation, spinal stenosis, or neural foraminal stenosis is seen. An 11 mm T2 hypointense nodule is noted in the left adrenal gland. IMPRESSION: 1. Mild cervical disc degeneration, most notable at C5-6 where there is moderate right neural foraminal stenosis. No cervical spinal stenosis. 2. No  thoracic disc herniation or stenosis. 3. Normal appearance of the cervical and thoracic spinal cord. 4. 11 mm left adrenal nodule. Assuming the patient has no history of malignancy, this could be further characterized by abdominal CT or MRI in 12 months. Electronically Signed   By: Sebastian Ache M.D.   On: 01/05/2015 18:16         Assessment/Plan 1. Back pain:  The patient has an unfortunate history of chronic pain.  There was concern for cauda equina syndrome raised by the patient's expression of numbness around the anus, although this is a complaint that he has made many times in the past (per CareEverywhere) and has had cord compression ruled out many times in the past.    The patient sees what appears to be a residency clinic at [facility], who have tried referring him to a multidisciplinary pain clinic in the past.  If his MRI is normal, it would be reasonable to refer him back.  He and I discussed the utility of adjuncts like gabapentin and citalopram, which he needs, given that he actually sounds pretty functional.    -Admit to med-surg OBS status -Acetaminophen, ibuprofen for pain overnight -Start gabapentin, which had been prescribed as outpatient, 300 mg nightly to start (300 mg nightly for 5 days, then twice a day for 5 days, then 3 times a day until follow up with Dr. Alvino Chapel or IM resident at [facility]). -Hydromorphone 1 mg IV once an hour before MRI tomorrow -Finish MRI tomorrow       DVT PPx: Lovenox Diet: Regular Consultants: None Code Status: Full Family Communication: Partner, present at bedside  Medical decision making: What exists of the patient's previous chart was reviewed in depth and the case was discussed with Dr. Margreta Journey. Patient seen 8:07 PM on 01/05/2015.  Disposition Plan:  Admit to OBS to finish MRI.  If normal, refer to patient's regular primary care.      Evan Kim [facility] Pager [phone]

## 2015-01-05 NOTE — ED Notes (Signed)
[facility] here to transfer [facility] ed. [facility].

## 2015-01-05 NOTE — ED Notes (Signed)
Patient states he has three masses on his S1 vertebrae  which is pressing on his sciatic nerve causes pain and numbness in his lower back and buttock with radiation down to his bilateral mid thighs. States he was seen by his Specialist and given gabapentin, but has not started yet.  States the pain has gotten worse over the last two or three days and he is unable to defecate.  Now he has numbness and tingling in his right hand and forearm.

## 2015-01-05 NOTE — ED Provider Notes (Signed)
Pt to the ER as a transfer from [facility], he was transferred to obtain MRI of the cervical, thoracic and lumbar spine. He has a remote history of a cyst at S1 on the back with a 33% chance of mortality therefore surgery was not done per pt. [facility]. In the past 3 days he has had acute numbness to his low back that goes down the outside of his legs. He also reports being unable to have a bowel movement because of pain. He also has los of perirectal sensation. Now having right upper extremity spasm and pain.   Patients pain being managed in the ER with protocol 1 mg IV Dilaudid q 2 hours for pain. He is waiting for cervical W/WO (confirmed with Dr. Adela LankFloyd), thoracicand lumbar MRI WO.  Filed Vitals:   01/05/15 1058 01/05/15 1251  BP: 149/101 141/91  Pulse: 64 78  Temp:  98.2 F (36.8 C)  Resp: 18    At end of shift patient hand off to resident Harlow OhmsJonah Gonalda, MD ER resident. Will consult specialist as needed per MRI results.  Marlon Peliffany Emoni Whitworth, PA-C 01/05/15 [phone]

## 2015-01-05 NOTE — ED Notes (Signed)
Pt currently in MRI

## 2015-01-06 ENCOUNTER — Observation Stay (HOSPITAL_COMMUNITY): Payer: Medicaid Other

## 2015-01-06 DIAGNOSIS — M545 Low back pain: Secondary | ICD-10-CM | POA: Diagnosis not present

## 2015-01-06 DIAGNOSIS — I10 Essential (primary) hypertension: Secondary | ICD-10-CM

## 2015-01-06 DIAGNOSIS — M5440 Lumbago with sciatica, unspecified side: Secondary | ICD-10-CM

## 2015-01-06 MED ORDER — HYDROMORPHONE HCL 1 MG/ML IJ SOLN
0.5000 mg | Freq: Once | INTRAMUSCULAR | Status: AC
  Administered 2015-01-06: 0.5 mg via INTRAVENOUS
  Filled 2015-01-06: qty 1

## 2015-01-06 MED ORDER — IBUPROFEN 600 MG PO TABS: 600.0000 mg | ORAL_TABLET | Freq: Four times a day (QID) | ORAL | Status: DC | PRN

## 2015-01-06 MED ORDER — POLYETHYLENE GLYCOL 3350 17 G PO PACK
17.0000 g | PACK | Freq: Every day | ORAL | Status: DC
  Administered 2015-01-06: 17 g via ORAL
  Filled 2015-01-06: qty 1

## 2015-01-06 MED ORDER — HYDROMORPHONE HCL 1 MG/ML IJ SOLN
1.0000 mg | Freq: Once | INTRAMUSCULAR | Status: AC
  Administered 2015-01-06: 1 mg via INTRAVENOUS

## 2015-01-06 NOTE — Discharge Summary (Signed)
Physician Discharge Summary  Evan Kim GNF:[id number] DOB: 24-Nov-1965 DOA: 01/05/2015  PCP: No PCP Per Patient  Admit date: 01/05/2015 Discharge date: 01/06/2015  Time spent: 25  minutes  Recommendations for Outpatient Follow-up:  1. Follow up with PCP in 2-4 weeks, hospital follow up needs to be referred to the pain clinic   Discharge Diagnoses:  Principal Problem:   Back pain Active Problems:   Essential hypertension   GERD (gastroesophageal reflux disease)   Chronic pain   Discharge Condition: stable  Diet recommendation: regular  Filed Weights   01/05/15 0906 01/05/15 2140  Weight: 105.235 kg (232 lb) 107.23 kg (236 lb 6.4 oz)    History of present illness:  49 y.o. male with a past medical history significant for post-lumbar laminectomy chronic pain syndrome, known sacral meningoceles and GERD who presents with acute on chronic back pain.  The patient was in his usual state of health, meaning chronic severe daily pain, but still able to drive, visit his daughter at school for lunch, and do some work around the house, until yesterday when the family drove to [place] for Thanksgiving when the patient had "severe, stabbing sharp" pain in the low back while riding in the car.   Hospital Course:  Back pain Active Problems:  Essential hypertension  GERD (gastroesophageal reflux disease)  Chronic pain Had an MRI of the lumbar spine with no compression. MRI C-spine and throacici showed no acute abnormality.We'll continue Tylenol and ibuprofen. Last year when he was awake for this he was prescribed gabapentin. We will continue this as an outpatient.  He should follow-up with Dr. Alvino Chapel internal medicine resident at [facility].  Procedures:  MRI Lumbar, thoracic and cervical  Consultations:  none  Discharge Exam: Filed Vitals:   01/06/15 0204 01/06/15 0526  BP: 101/65 128/74  Pulse: 75 51  Temp: 98 F (36.7 C) 98.5 F (36.9 C)  Resp: 18 20     General: see progress note  Discharge Instructions   Discharge Instructions    Diet - low sodium heart healthy    Complete by:  As directed      Increase activity slowly    Complete by:  As directed           Current Discharge Medication List    START taking these medications   Details  ibuprofen (ADVIL,MOTRIN) 600 MG tablet Take 1 tablet (600 mg total) by mouth every 6 (six) hours as needed for moderate pain. Qty: 30 tablet, Refills: 0      CONTINUE these medications which have NOT CHANGED   Details  acetaminophen (TYLENOL) 500 MG tablet Take 500 mg by mouth every 6 (six) hours as needed for mild pain.    cyclobenzaprine (FLEXERIL) 5 MG tablet Take 10 mg by mouth 3 (three) times daily as needed for muscle spasms.    EPINEPHrine 0.3 mg/0.3 mL IJ SOAJ injection Inject 0.3 mg into the muscle once.    esomeprazole (NEXIUM) 20 MG capsule Take 20 mg by mouth daily before breakfast.    GABAPENTIN PO Take by mouth.    valACYclovir (VALTREX) 500 MG tablet Take 500 mg by mouth daily.       Allergies  Allergen Reactions  . Bee Venom Anaphylaxis  . Oxycodone Hives   Follow-up Information    Please follow up.   Why:  follow up with PCP in 2-4 weeks, needs a pain clinic       The results of significant diagnostics from this hospitalization (including  imaging, microbiology, ancillary and laboratory) are listed below for reference.    Significant Diagnostic Studies: Mr Cervical Spine Wo Contrast  01/05/2015  CLINICAL DATA:  Abnormal neurological examination. Acute numbness involving the low back and outside of both legs. Right upper extremity spasm and pain. Chronic low back pain. Unable to have a bowel movement due to pain. Loss of perirectal sensation. EXAM: MRI CERVICAL SPINE WITHOUT CONTRAST; MRI THORACIC SPINE WITHOUT CONTRAST TECHNIQUE: Multiplanar and multiecho pulse sequences of the cervical spine, to include the craniocervical junction and cervicothoracic  junction, were obtained according to standard protocol without intravenous contrast.; Multiplanar and multiecho pulse sequences of the thoracic spine were obtained without intravenous contrast. COMPARISON:  None. FINDINGS: MR CERVICAL SPINE FINDINGS: There is mild straightening of the normal cervical lordosis. There is no significant listhesis. Vertebral body heights are preserved. Mild disc space narrowing and degenerative endplate changes are present at C5-6 and C6-7. No significant vertebral marrow edema is seen. Craniocervical junction is unremarkable. Cervical spinal cord is normal in caliber and signal. The paraspinal soft tissues are unremarkable. C2-3: Mild disc bulging and asymmetric right uncovertebral hypertrophy without significant stenosis. C3-4: Mild disc bulging and bilateral uncovertebral hypertrophy without significant stenosis. C4-5: Mild disc bulging and right greater than left uncovertebral hypertrophy results in minimal right neural foraminal narrowing without spinal stenosis. C5-6: Small right posterolateral disc extrusion with slight caudal migration, mild disc bulging, and right greater than left uncovertebral hypertrophy result in moderate right neural foraminal stenosis without spinal stenosis. C6-7: Mild disc bulging and uncovertebral hypertrophy results in mild bilateral neural foraminal stenosis without spinal stenosis. C7-T1:  Negative. MR THORACIC SPINE FINDINGS: Vertebral alignment is normal. Vertebral body heights are preserved without evidence of compression fracture. A few small Schmorl's nodes are noted in the mid to lower thoracic spine. No vertebral marrow edema is seen. The thoracic spinal cord is normal in caliber and signal. No thoracic disc herniation, spinal stenosis, or neural foraminal stenosis is seen. An 11 mm T2 hypointense nodule is noted in the left adrenal gland. IMPRESSION: 1. Mild cervical disc degeneration, most notable at C5-6 where there is moderate right  neural foraminal stenosis. No cervical spinal stenosis. 2. No thoracic disc herniation or stenosis. 3. Normal appearance of the cervical and thoracic spinal cord. 4. 11 mm left adrenal nodule. Assuming the patient has no history of malignancy, this could be further characterized by abdominal CT or MRI in 12 months. Electronically Signed   By: Sebastian AcheAllen  Grady M.D.   On: 01/05/2015 18:16   Mr Thoracic Spine Wo Contrast  01/05/2015  CLINICAL DATA:  Abnormal neurological examination. Acute numbness involving the low back and outside of both legs. Right upper extremity spasm and pain. Chronic low back pain. Unable to have a bowel movement due to pain. Loss of perirectal sensation. EXAM: MRI CERVICAL SPINE WITHOUT CONTRAST; MRI THORACIC SPINE WITHOUT CONTRAST TECHNIQUE: Multiplanar and multiecho pulse sequences of the cervical spine, to include the craniocervical junction and cervicothoracic junction, were obtained according to standard protocol without intravenous contrast.; Multiplanar and multiecho pulse sequences of the thoracic spine were obtained without intravenous contrast. COMPARISON:  None. FINDINGS: MR CERVICAL SPINE FINDINGS: There is mild straightening of the normal cervical lordosis. There is no significant listhesis. Vertebral body heights are preserved. Mild disc space narrowing and degenerative endplate changes are present at C5-6 and C6-7. No significant vertebral marrow edema is seen. Craniocervical junction is unremarkable. Cervical spinal cord is normal in caliber and signal. The paraspinal soft tissues are  unremarkable. C2-3: Mild disc bulging and asymmetric right uncovertebral hypertrophy without significant stenosis. C3-4: Mild disc bulging and bilateral uncovertebral hypertrophy without significant stenosis. C4-5: Mild disc bulging and right greater than left uncovertebral hypertrophy results in minimal right neural foraminal narrowing without spinal stenosis. C5-6: Small right posterolateral  disc extrusion with slight caudal migration, mild disc bulging, and right greater than left uncovertebral hypertrophy result in moderate right neural foraminal stenosis without spinal stenosis. C6-7: Mild disc bulging and uncovertebral hypertrophy results in mild bilateral neural foraminal stenosis without spinal stenosis. C7-T1:  Negative. MR THORACIC SPINE FINDINGS: Vertebral alignment is normal. Vertebral body heights are preserved without evidence of compression fracture. A few small Schmorl's nodes are noted in the mid to lower thoracic spine. No vertebral marrow edema is seen. The thoracic spinal cord is normal in caliber and signal. No thoracic disc herniation, spinal stenosis, or neural foraminal stenosis is seen. An 11 mm T2 hypointense nodule is noted in the left adrenal gland. IMPRESSION: 1. Mild cervical disc degeneration, most notable at C5-6 where there is moderate right neural foraminal stenosis. No cervical spinal stenosis. 2. No thoracic disc herniation or stenosis. 3. Normal appearance of the cervical and thoracic spinal cord. 4. 11 mm left adrenal nodule. Assuming the patient has no history of malignancy, this could be further characterized by abdominal CT or MRI in 12 months. Electronically Signed   By: Sebastian Ache M.D.   On: 01/05/2015 18:16   Mr Lumbar Spine Wo Contrast  01/06/2015  CLINICAL DATA:  Chronic low back pain. History of prior lumbar surgery. The patient's pain acutely worsened 01/04/2015. No known injury. Subsequent encounter. EXAM: MRI LUMBAR SPINE WITHOUT CONTRAST TECHNIQUE: Multiplanar, multisequence MR imaging of the lumbar spine was performed. No intravenous contrast was administered. COMPARISON:  Plain films lumbar spine 12/06/2013. FINDINGS: The patient is status post L5-S1 fusion. There is 0.5 cm anterolisthesis L5 on S1. Alignment is otherwise normal. Vertebral body height and signal are maintained. The conus medullaris is normal in signal and position. Imaged  intra-abdominal contents are unremarkable. T11-12: Negative. T12-L1:  Negative. L1-2: Mild facet degenerative disease on the left. Otherwise negative. L2-3:  Negative. L3-4: Mild to moderate facet degenerative disease and a minimal disc bulge. The central canal and foramina are patent. L4-5: Shallow disc bulge without central canal or foraminal narrowing. L5-S1: Status post laminectomy and fusion. The central canal and foramina are widely patent. IMPRESSION: No acute abnormality or finding to explain the patient's symptoms. Status post L5-S1 fusion. The central canal and foramina are widely patent at L5-S1 and all other levels of the lumbar spine. Electronically Signed   By: Drusilla Kanner M.D.   On: 01/06/2015 10:19    Microbiology: No results found for this or any previous visit (from the past 240 hour(s)).   Labs: Basic Metabolic Panel:  Recent Labs Lab 01/05/15 1015 01/05/15 2218  NA 138  --   K 3.9  --   CL 108  --   CO2 23  --   GLUCOSE 106*  --   BUN 13  --   CREATININE 0.94 1.03  CALCIUM 9.4  --    Liver Function Tests: No results for input(s): AST, ALT, ALKPHOS, BILITOT, PROT, ALBUMIN in the last 168 hours. No results for input(s): LIPASE, AMYLASE in the last 168 hours. No results for input(s): AMMONIA in the last 168 hours. CBC:  Recent Labs Lab 01/05/15 1015 01/05/15 2218  WBC 5.0 5.7  NEUTROABS 2.8  --  HGB 14.2 12.9*  HCT 42.2 39.0  MCV 86.8 89.0  PLT 272 235   Cardiac Enzymes: No results for input(s): CKTOTAL, CKMB, CKMBINDEX, TROPONINI in the last 168 hours. BNP: BNP (last 3 results) No results for input(s): BNP in the last 8760 hours.  ProBNP (last 3 results) No results for input(s): PROBNP in the last 8760 hours.  CBG:  Recent Labs Lab 01/05/15 1525  GLUCAP 81       Signed:  Marinda Elk  [facility] 01/06/2015, 12:36 PM
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
## 2015-01-06 NOTE — Progress Notes (Signed)
Patient stated he needed to speak with attending MD in person prior to discharge. MD notified, MD stated he would not be able to see patient immediately, but that he would be able to see patient later on in afternoon. Patient was notified. Patient stated he would like to continue with discharge, requested MRI records and imaging records to be released to him. Request for records filled out, signed by patient, faxed to medical records. Patient advised to confirm that medical records received request on Monday. Patient was then transported to car by nursing tech. Patient was complaining of 10/10 pain, uncontrolled pain, MD aware. Neuro assessment unchanged.

## 2015-01-06 NOTE — Discharge Instructions (Signed)
Chronic Back Pain  When back pain lasts longer than 3 months, it is called chronic back pain.People with chronic back pain often go through certain periods that are more intense (flare-ups).  CAUSES Chronic back pain can be caused by wear and tear (degeneration) on different structures in your back. These structures include:  The bones of your spine (vertebrae) and the joints surrounding your spinal cord and nerve roots (facets).  The strong, fibrous tissues that connect your vertebrae (ligaments). Degeneration of these structures may result in pressure on your nerves. This can lead to constant pain. HOME CARE INSTRUCTIONS  Avoid bending, heavy lifting, prolonged sitting, and activities which make the problem worse.  Take brief periods of rest throughout the day to reduce your pain. Lying down or standing usually is better than sitting while you are resting.  Take over-the-counter or prescription medicines only as directed by your caregiver. SEEK IMMEDIATE MEDICAL CARE IF:   You have weakness or numbness in one of your legs or feet.  You have trouble controlling your bladder or bowels.  You have nausea, vomiting, abdominal pain, shortness of breath, or fainting.   This information is not intended to replace advice given to you by your health care provider. Make sure you discuss any questions you have with your health care provider.   Document Released: 03/06/2004 Document Revised: 04/21/2011 Document Reviewed: 07/17/2014 Elsevier Interactive Patient Education 2016 [employer]  Ibuprofen tablets and capsules What is this medicine? IBUPROFEN (eye BYOO proe fen) is a non-steroidal anti-inflammatory drug (NSAID). It is used for dental pain, fever, headaches or migraines, osteoarthritis, rheumatoid arthritis, or painful monthly periods. It can also relieve minor aches and pains caused by a cold, flu, or sore throat. This medicine may be used for other purposes; ask your health care  provider or pharmacist if you have questions. What should I tell my health care provider before I take this medicine? They need to know if you have any of these conditions: -asthma -cigarette smoker -drink more than 3 alcohol containing drinks a day -heart disease or circulation problems such as heart failure or leg edema (fluid retention) -high blood pressure -kidney disease -liver disease -stomach bleeding or ulcers -an unusual or allergic reaction to ibuprofen, aspirin, other NSAIDS, other medicines, foods, dyes, or preservatives -pregnant or trying to get pregnant -breast-feeding How should I use this medicine? Take this medicine by mouth with a glass of water. Follow the directions on the prescription label. Take this medicine with food if your stomach gets upset. Try to not lie down for at least 10 minutes after you take the medicine. Take your medicine at regular intervals. Do not take your medicine more often than directed. A special MedGuide will be given to you by the pharmacist with each prescription and refill. Be sure to read this information carefully each time. Talk to your pediatrician regarding the use of this medicine in children. Special care may be needed. Overdosage: If you think you have taken too much of this medicine contact a poison control center or emergency room at once. NOTE: This medicine is only for you. Do not share this medicine with others. What if I miss a dose? If you miss a dose, take it as soon as you can. If it is almost time for your next dose, take only that dose. Do not take double or extra doses. What may interact with this medicine? Do not take this medicine with any of the following medications: -cidofovir -ketorolac -methotrexate -pemetrexed  This medicine may also interact with the following medications: -alcohol -aspirin -diuretics -lithium -other drugs for inflammation like prednisone -warfarin This list may not describe all possible  interactions. Give your health care provider a list of all the medicines, herbs, non-prescription drugs, or dietary supplements you use. Also tell them if you smoke, drink alcohol, or use illegal drugs. Some items may interact with your medicine. What should I watch for while using this medicine? Tell your doctor or healthcare professional if your symptoms do not start to get better or if they get worse. This medicine does not prevent heart attack or stroke. In fact, this medicine may increase the chance of a heart attack or stroke. The chance may increase with longer use of this medicine and in people who have heart disease. If you take aspirin to prevent heart attack or stroke, talk with your doctor or health care professional. Do not take other medicines that contain aspirin, ibuprofen, or naproxen with this medicine. Side effects such as stomach upset, nausea, or ulcers may be more likely to occur. Many medicines available without a prescription should not be taken with this medicine. This medicine can cause ulcers and bleeding in the stomach and intestines at any time during treatment. Ulcers and bleeding can happen without warning symptoms and can cause death. To reduce your risk, do not smoke cigarettes or drink alcohol while you are taking this medicine. You may get drowsy or dizzy. Do not drive, use machinery, or do anything that needs mental alertness until you know how this medicine affects you. Do not stand or sit up quickly, especially if you are an older patient. This reduces the risk of dizzy or fainting spells. This medicine can cause you to bleed more easily. Try to avoid damage to your teeth and gums when you brush or floss your teeth. This medicine may be used to treat migraines. If you take migraine medicines for 10 or more days a month, your migraines may get worse. Keep a diary of headache days and medicine use. Contact your healthcare professional if your migraine attacks occur more  frequently. What side effects may I notice from receiving this medicine? Side effects that you should report to your doctor or health care professional as soon as possible: -allergic reactions like skin rash, itching or hives, swelling of the face, lips, or tongue -severe stomach pain -signs and symptoms of bleeding such as bloody or black, tarry stools; red or dark-brown urine; spitting up blood or brown material that looks like coffee grounds; red spots on the skin; unusual bruising or bleeding from the eye, gums, or nose -signs and symptoms of a blood clot such as changes in vision; chest pain; severe, sudden headache; trouble speaking; sudden numbness or weakness of the face, arm, or leg -unexplained weight gain or swelling -unusually weak or tired -yellowing of eyes or skin Side effects that usually do not require medical attention (report to your doctor or health care professional if they continue or are bothersome): -bruising -diarrhea -dizziness, drowsiness -headache -nausea, vomiting This list may not describe all possible side effects. Call your doctor for medical advice about side effects. You may report side effects to FDA at 1-800-FDA-1088. Where should I keep my medicine? Keep out of the reach of children. Store at room temperature between 15 and 30 degrees C (59 and 86 degrees F). Keep container tightly closed. Throw away any unused medicine after the expiration date. NOTE: This sheet is a summary. It may not cover  all possible information. If you have questions about this medicine, talk to your doctor, pharmacist, or health care provider.    2016, Elsevier/Gold Standard. (2012-09-28 10:48:02)

## 2015-01-06 NOTE — Progress Notes (Signed)
Patient complains of bilateral upper and lower extermity numbness, tingling pain and stabbing pain in mid to lower back. Patient states he has chronic pain, no medications work. Patient requested to speak to MD in order to view the MRI images prior to leaving the hospital. MD paged. Patients peripheral RAC IV removed. All discharge instructions including current medication regimen and new medication, follow-up appointment with PCP in order to get established with local pain clininc, personal belongings, mychart, managing chronic pain and information for Ibuprophen has been reviewed with patient and patients mother. Patient verbalized understanding and stated that he will wait for MD in order to view test results prior leaving.

## 2015-01-17 ENCOUNTER — Other Ambulatory Visit: Payer: Self-pay

## 2015-01-17 ENCOUNTER — Emergency Department (HOSPITAL_BASED_OUTPATIENT_CLINIC_OR_DEPARTMENT_OTHER)
Admission: EM | Admit: 2015-01-17 | Discharge: 2015-01-17 | Disposition: A | Discharge status: Home / Self Care | Payer: Medicaid Other | Attending: Emergency Medicine | Admitting: Emergency Medicine

## 2015-01-17 ENCOUNTER — Emergency Department (HOSPITAL_BASED_OUTPATIENT_CLINIC_OR_DEPARTMENT_OTHER): Payer: Medicaid Other

## 2015-01-17 ENCOUNTER — Encounter (HOSPITAL_BASED_OUTPATIENT_CLINIC_OR_DEPARTMENT_OTHER): Payer: Self-pay | Admitting: Emergency Medicine

## 2015-01-17 DIAGNOSIS — Z79899 Other long term (current) drug therapy: Secondary | ICD-10-CM | POA: Diagnosis not present

## 2015-01-17 DIAGNOSIS — H748X3 Other specified disorders of middle ear and mastoid, bilateral: Secondary | ICD-10-CM | POA: Diagnosis not present

## 2015-01-17 DIAGNOSIS — R0981 Nasal congestion: Secondary | ICD-10-CM | POA: Diagnosis present

## 2015-01-17 DIAGNOSIS — Z872 Personal history of diseases of the skin and subcutaneous tissue: Secondary | ICD-10-CM | POA: Insufficient documentation

## 2015-01-17 DIAGNOSIS — M791 Myalgia: Secondary | ICD-10-CM | POA: Insufficient documentation

## 2015-01-17 DIAGNOSIS — G8929 Other chronic pain: Secondary | ICD-10-CM | POA: Insufficient documentation

## 2015-01-17 DIAGNOSIS — J069 Acute upper respiratory infection, unspecified: Secondary | ICD-10-CM

## 2015-01-17 DIAGNOSIS — Z8719 Personal history of other diseases of the digestive system: Secondary | ICD-10-CM | POA: Insufficient documentation

## 2015-01-17 MED ORDER — ALBUTEROL SULFATE HFA 108 (90 BASE) MCG/ACT IN AERS
1.0000 | INHALATION_SPRAY | Freq: Four times a day (QID) | RESPIRATORY_TRACT | Status: DC | PRN
  Administered 2015-01-17: 2 via RESPIRATORY_TRACT
  Filled 2015-01-17: qty 6.7

## 2015-01-17 MED ORDER — OSELTAMIVIR PHOSPHATE 75 MG PO CAPS: 75.0000 mg | ORAL_CAPSULE | Freq: Two times a day (BID) | ORAL | Status: DC

## 2015-01-17 MED ORDER — HYDROCODONE-HOMATROPINE 5-1.5 MG/5ML PO SYRP: 5.0000 mL | ORAL_SOLUTION | Freq: Four times a day (QID) | ORAL | Status: DC | PRN

## 2015-01-17 MED ORDER — ACETAMINOPHEN 325 MG PO TABS
650.0000 mg | ORAL_TABLET | Freq: Once | ORAL | Status: AC
Start: 2015-01-17 — End: 2015-01-17
  Administered 2015-01-17: 650 mg via ORAL
  Filled 2015-01-17: qty 2

## 2015-01-17 NOTE — ED Notes (Signed)
Pt reports acute onset of sob, cough congestion, recent hospital admission

## 2015-01-17 NOTE — Discharge Instructions (Signed)
Upper Respiratory Infection, Adult Most upper respiratory infections (URIs) are a viral infection of the air passages leading to the lungs. A URI affects the nose, throat, and upper air passages. The most common type of URI is nasopharyngitis and is typically referred to as "the common cold." URIs run their course and usually go away on their own. Most of the time, a URI does not require medical attention, but sometimes a bacterial infection in the upper airways can follow a viral infection. This is called a secondary infection. Sinus and middle ear infections are common types of secondary upper respiratory infections. Bacterial pneumonia can also complicate a URI. A URI can worsen asthma and chronic obstructive pulmonary disease (COPD). Sometimes, these complications can require emergency medical care and may be life threatening.  CAUSES Almost all URIs are caused by viruses. A virus is a type of germ and can spread from one person to another.  RISKS FACTORS You may be at risk for a URI if:   You smoke.   You have chronic heart or lung disease.  You have a weakened defense (immune) system.   You are very young or very old.   You have nasal allergies or asthma.  You work in crowded or poorly ventilated areas.  You work in health care facilities or schools. SIGNS AND SYMPTOMS  Symptoms typically develop 2-3 days after you come in contact with a cold virus. Most viral URIs last 7-10 days. However, viral URIs from the influenza virus (flu virus) can last 14-18 days and are typically more severe. Symptoms may include:   Runny or stuffy (congested) nose.   Sneezing.   Cough.   Sore throat.   Headache.   Fatigue.   Fever.   Loss of appetite.   Pain in your forehead, behind your eyes, and over your cheekbones (sinus pain).  Muscle aches.  DIAGNOSIS  Your health care provider may diagnose a URI by:  Physical exam.  Tests to check that your symptoms are not due to  another condition such as:  Strep throat.  Sinusitis.  Pneumonia.  Asthma. TREATMENT  A URI goes away on its own with time. It cannot be cured with medicines, but medicines may be prescribed or recommended to relieve symptoms. Medicines may help:  Reduce your fever.  Reduce your cough.  Relieve nasal congestion. HOME CARE INSTRUCTIONS   Take medicines only as directed by your health care provider.   Gargle warm saltwater or take cough drops to comfort your throat as directed by your health care provider.  Use a warm mist humidifier or inhale steam from a shower to increase air moisture. This may make it easier to breathe.  Drink enough fluid to keep your urine clear or pale yellow.   Eat soups and other clear broths and maintain good nutrition.   Rest as needed.   Return to work when your temperature has returned to normal or as your health care provider advises. You may need to stay home longer to avoid infecting others. You can also use a face mask and careful hand washing to prevent spread of the virus.  Increase the usage of your inhaler if you have asthma.   Do not use any tobacco products, including cigarettes, chewing tobacco, or electronic cigarettes. If you need help quitting, ask your health care provider. PREVENTION  The best way to protect yourself from getting a cold is to practice good hygiene.   Avoid oral or hand contact with people with cold   symptoms.   Wash your hands often if contact occurs.  There is no clear evidence that vitamin C, vitamin E, echinacea, or exercise reduces the chance of developing a cold. However, it is always recommended to get plenty of rest, exercise, and practice good nutrition.  SEEK MEDICAL CARE IF:   You are getting worse rather than better.   Your symptoms are not controlled by medicine.   You have chills.  You have worsening shortness of breath.  You have brown or red mucus.  You have yellow or brown nasal  discharge.  You have pain in your face, especially when you bend forward.  You have a fever.  You have swollen neck glands.  You have pain while swallowing.  You have white areas in the back of your throat. SEEK IMMEDIATE MEDICAL CARE IF:   You have severe or persistent:  Headache.  Ear pain.  Sinus pain.  Chest pain.  You have chronic lung disease and any of the following:  Wheezing.  Prolonged cough.  Coughing up blood.  A change in your usual mucus.  You have a stiff neck.  You have changes in your:  Vision.  Hearing.  Thinking.  Mood. MAKE SURE YOU:   Understand these instructions.  Will watch your condition.  Will get help right away if you are not doing well or get worse.   This information is not intended to replace advice given to you by your health care provider. Make sure you discuss any questions you have with your health care provider.   Document Released: 07/23/2000 Document Revised: 06/13/2014 Document Reviewed: 05/04/2013 Elsevier Interactive Patient Education 2016 Elsevier Inc.  

## 2015-01-17 NOTE — ED Provider Notes (Addendum)
CSN: [id number]     Arrival date & time 01/17/15  1033 History   First MD Initiated Contact with Patient 01/17/15 1040     Chief Complaint  Patient presents with  . Nasal Congestion     (Consider location/radiation/quality/duration/timing/severity/associated sxs/prior Treatment) Patient is a 49 y.o. male presenting with URI. The history is provided by the patient.  URI Presenting symptoms: congestion, cough, fatigue, fever, rhinorrhea and sore throat   Severity:  Severe Onset quality:  Gradual Duration:  1 day Timing:  Constant Progression:  Worsening Chronicity:  New Relieved by:  Nothing Exacerbated by: lying down and coughing. Ineffective treatments:  OTC medications Associated symptoms: myalgias   Associated symptoms: no neck pain and no wheezing   Associated symptoms comment:  Feels SOB and post tussive emesis Risk factors: sick contacts   Risk factors: no chronic cardiac disease, no chronic kidney disease and no chronic respiratory disease   Risk factors comment:  Patiently was recently hospitalized for back issues that time was having no URI symptoms   Past Medical History  Diagnosis Date  . Back pain, chronic   . Reflux   . Ulcer disease   . 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    Past Surgical History  Procedure Laterality Date  . Back surgery    . Total knee arthroplasty    . Anterior cruciate ligament repair      bilateral knees   Family History  Problem Relation Age of Onset  . Migraines Mother   . Osteoarthritis Brother   . Heart attack Maternal Grandmother    Social History  Substance Use Topics  . Smoking status: Never Smoker   . Smokeless tobacco: None  . Alcohol Use: Yes     Comment: occ    Review of Systems  Constitutional: Positive for fever and fatigue.  HENT: Positive for congestion, rhinorrhea and sore throat.   Respiratory: Positive for cough. Negative for wheezing.   Musculoskeletal: Positive for myalgias. Negative for  neck pain.  All other systems reviewed and are negative.     Allergies  Bee venom and Oxycodone  Home Medications   Prior to Admission medications   Medication Sig Start Date End Date Taking? Authorizing Provider  acetaminophen (TYLENOL) 500 MG tablet Take 500 mg by mouth every 6 (six) hours as needed for mild pain.   Yes Historical Provider, MD  cyclobenzaprine (FLEXERIL) 5 MG tablet Take 10 mg by mouth 3 (three) times daily as needed for muscle spasms.   Yes Historical Provider, MD  EPINEPHrine 0.3 mg/0.3 mL IJ SOAJ injection Inject 0.3 mg into the muscle once.   Yes Historical Provider, MD  esomeprazole (NEXIUM) 20 MG capsule Take 20 mg by mouth daily before breakfast.   Yes Historical Provider, MD  GABAPENTIN PO Take by mouth.   Yes Historical Provider, MD  ibuprofen (ADVIL,MOTRIN) 600 MG tablet Take 1 tablet (600 mg total) by mouth every 6 (six) hours as needed for moderate pain. 01/06/15  Yes Marinda ElkAbraham Feliz Ortiz, MD   BP 156/100 mmHg  Pulse 75  Temp(Src) 99 F (37.2 C) (Oral)  Resp 20  Ht 6\' 2"  (1.88 m)  Wt 230 lb (104.327 kg)  BMI 29.52 kg/m2  SpO2 100% Physical Exam  Constitutional: He is oriented to person, place, and time. He appears well-developed and well-nourished. No distress.  HENT:  Head: Normocephalic and atraumatic.  Right Ear: A middle ear effusion is present.  Left Ear: A  middle ear effusion is present.  Nose: Mucosal edema and rhinorrhea present.  Mouth/Throat: Posterior oropharyngeal erythema present. No oropharyngeal exudate, posterior oropharyngeal edema or tonsillar abscesses.  Eyes: Conjunctivae and EOM are normal. Pupils are equal, round, and reactive to light.  Neck: Normal range of motion. Neck supple.  Cardiovascular: Normal rate, regular rhythm and intact distal pulses.   No murmur heard. Pulmonary/Chest: Effort normal and breath sounds normal. No respiratory distress. He has no wheezes. He has no rales.  Abdominal: Soft. He exhibits no  distension. There is no tenderness. There is no rebound and no guarding.  Musculoskeletal: Normal range of motion. He exhibits no edema or tenderness.  Lymphadenopathy:    He has cervical adenopathy.  Neurological: He is alert and oriented to person, place, and time.  Skin: Skin is warm and dry. No rash noted. No erythema.  Psychiatric: He has a normal mood and affect. His behavior is normal.  Nursing note and vitals reviewed.   ED Course  Procedures (including critical care time) Labs Review Labs Reviewed - No data to display  Imaging Review Dg Chest 2 View  01/17/2015  CLINICAL DATA:  Cough and fever short of breath. EXAM: CHEST  2 VIEW COMPARISON:  None. FINDINGS: The heart size and mediastinal contours are within normal limits. Both lungs are clear. The visualized skeletal structures are unremarkable. IMPRESSION: No active cardiopulmonary disease. Electronically Signed   By: Marlan Palau M.D.   On: 01/17/2015 11:26   I have personally reviewed and evaluated these images and lab results as part of my medical decision-making.   EKG Interpretation   Date/Time:  Wednesday January 17 2015 10:47:54 EST Ventricular Rate:  62 PR Interval:  226 QRS Duration: 82 QT Interval:  394 QTC Calculation: 399 R Axis:   75 Text Interpretation:  Sinus rhythm with 1st degree A-V block Nonspecific T  wave abnormality No significant change since last tracing Confirmed by  Anitra Lauth  MD, Krishawn Vanderweele ([id number]) on 01/17/2015 10:58:14 AM      MDM   Final diagnoses:  None    Pt with symptoms consistent with viral URI vs flu.  Well appearing here.  No signs of breathing difficulty  No signs of pharyngitis, otitis or abnormal abdominal findings.  Low suspicion the patient's symptoms are related to PE, cardiac disease. CXR wnl and pt to return with any further problems.  Pt treated for possible early flu with tamiflu and cough suppressant     Gwyneth Sprout, MD 01/17/15 1140  Gwyneth Sprout,  MD 01/17/15 1141

## 2016-01-23 ENCOUNTER — Encounter (HOSPITAL_BASED_OUTPATIENT_CLINIC_OR_DEPARTMENT_OTHER): Payer: Self-pay

## 2016-01-23 ENCOUNTER — Emergency Department (HOSPITAL_BASED_OUTPATIENT_CLINIC_OR_DEPARTMENT_OTHER)
Admission: EM | Admit: 2016-01-23 | Discharge: 2016-01-23 | Disposition: A | Discharge status: Home / Self Care | Payer: Medicaid Other | Attending: Emergency Medicine | Admitting: Emergency Medicine

## 2016-01-23 ENCOUNTER — Emergency Department (HOSPITAL_BASED_OUTPATIENT_CLINIC_OR_DEPARTMENT_OTHER): Payer: Medicaid Other

## 2016-01-23 DIAGNOSIS — R079 Chest pain, unspecified: Secondary | ICD-10-CM | POA: Diagnosis present

## 2016-01-23 DIAGNOSIS — I10 Essential (primary) hypertension: Secondary | ICD-10-CM | POA: Insufficient documentation

## 2016-01-23 DIAGNOSIS — Z79899 Other long term (current) drug therapy: Secondary | ICD-10-CM | POA: Diagnosis not present

## 2016-01-23 DIAGNOSIS — R0789 Other chest pain: Secondary | ICD-10-CM

## 2016-01-23 HISTORY — DX: Essential (primary) hypertension: I10

## 2016-01-23 LAB — COMPREHENSIVE METABOLIC PANEL
ALT: 19 U/L (ref 17–63)
AST: 23 U/L (ref 15–41)
Albumin: 3.7 g/dL (ref 3.5–5.0)
Alkaline Phosphatase: 53 U/L (ref 38–126)
Anion gap: 7 (ref 5–15)
BILIRUBIN TOTAL: 1 mg/dL (ref 0.3–1.2)
BUN: 12 mg/dL (ref 6–20)
CALCIUM: 9.1 mg/dL (ref 8.9–10.3)
CO2: 25 mmol/L (ref 22–32)
CREATININE: 0.99 mg/dL (ref 0.61–1.24)
Chloride: 106 mmol/L (ref 101–111)
Glucose, Bld: 101 mg/dL — ABNORMAL HIGH (ref 65–99)
Potassium: 3.9 mmol/L (ref 3.5–5.1)
Sodium: 138 mmol/L (ref 135–145)
TOTAL PROTEIN: 6.7 g/dL (ref 6.5–8.1)

## 2016-01-23 LAB — CBC WITH DIFFERENTIAL/PLATELET
BASOS ABS: 0 10*3/uL (ref 0.0–0.1)
Basophils Relative: 1 %
EOS PCT: 2 %
Eosinophils Absolute: 0.1 10*3/uL (ref 0.0–0.7)
HEMATOCRIT: 40.3 % (ref 39.0–52.0)
Hemoglobin: 13.6 g/dL (ref 13.0–17.0)
LYMPHS ABS: 1.9 10*3/uL (ref 0.7–4.0)
LYMPHS PCT: 36 %
MCH: 29.4 pg (ref 26.0–34.0)
MCHC: 33.7 g/dL (ref 30.0–36.0)
MCV: 87.2 fL (ref 78.0–100.0)
MONO ABS: 0.5 10*3/uL (ref 0.1–1.0)
Monocytes Relative: 9 %
NEUTROS ABS: 2.7 10*3/uL (ref 1.7–7.7)
Neutrophils Relative %: 52 %
Platelets: 242 10*3/uL (ref 150–400)
RBC: 4.62 MIL/uL (ref 4.22–5.81)
RDW: 14.2 % (ref 11.5–15.5)
WBC: 5.2 10*3/uL (ref 4.0–10.5)

## 2016-01-23 LAB — TROPONIN I

## 2016-01-23 MED ORDER — KETOROLAC TROMETHAMINE 30 MG/ML IJ SOLN
30.0000 mg | Freq: Once | INTRAMUSCULAR | Status: AC
  Administered 2016-01-23: 30 mg via INTRAVENOUS
  Filled 2016-01-23: qty 1

## 2016-01-23 MED ORDER — CYCLOBENZAPRINE HCL 5 MG PO TABS
5.0000 mg | ORAL_TABLET | Freq: Once | ORAL | Status: AC
  Administered 2016-01-23: 5 mg via ORAL
  Filled 2016-01-23: qty 1

## 2016-01-23 MED ORDER — IBUPROFEN 800 MG PO TABS: 800.0000 mg | ORAL_TABLET | Freq: Three times a day (TID) | ORAL | 0 refills | Status: DC

## 2016-01-23 MED ORDER — MORPHINE SULFATE (PF) 4 MG/ML IV SOLN: 4.0000 mg | Freq: Once | INTRAVENOUS | Status: DC

## 2016-01-23 NOTE — ED Notes (Signed)
Patient transported to X-ray 

## 2016-01-23 NOTE — Discharge Instructions (Signed)
Take motrin for pain. Take it as scheduled for 3-4 days   Continue flexeril as prescribed by your doctor.   See your pain doctor as scheduled in 2 days.   Return to ER if you have severe chest pain, shortness of breath, numbness, weakness, back pain

## 2016-01-23 NOTE — ED Triage Notes (Addendum)
C/o CP x 3 days-states feels same as when had PNE in the past-denies cough, fever-NAD-steady gait
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
## 2016-07-28 ENCOUNTER — Emergency Department (HOSPITAL_BASED_OUTPATIENT_CLINIC_OR_DEPARTMENT_OTHER)
Admission: EM | Admit: 2016-07-28 | Discharge: 2016-07-28 | Disposition: A | Discharge status: Home / Self Care | Payer: Medicaid Other | Attending: Emergency Medicine | Admitting: Emergency Medicine

## 2016-07-28 ENCOUNTER — Emergency Department (HOSPITAL_BASED_OUTPATIENT_CLINIC_OR_DEPARTMENT_OTHER): Payer: Medicaid Other

## 2016-07-28 ENCOUNTER — Encounter (HOSPITAL_BASED_OUTPATIENT_CLINIC_OR_DEPARTMENT_OTHER): Payer: Self-pay | Admitting: *Deleted

## 2016-07-28 DIAGNOSIS — R059 Cough, unspecified: Secondary | ICD-10-CM

## 2016-07-28 DIAGNOSIS — G8929 Other chronic pain: Secondary | ICD-10-CM | POA: Diagnosis not present

## 2016-07-28 DIAGNOSIS — Z791 Long term (current) use of non-steroidal anti-inflammatories (NSAID): Secondary | ICD-10-CM | POA: Insufficient documentation

## 2016-07-28 DIAGNOSIS — R05 Cough: Secondary | ICD-10-CM | POA: Insufficient documentation

## 2016-07-28 DIAGNOSIS — Z79899 Other long term (current) drug therapy: Secondary | ICD-10-CM | POA: Diagnosis not present

## 2016-07-28 DIAGNOSIS — I10 Essential (primary) hypertension: Secondary | ICD-10-CM | POA: Insufficient documentation

## 2016-07-28 DIAGNOSIS — Z7982 Long term (current) use of aspirin: Secondary | ICD-10-CM | POA: Diagnosis not present

## 2016-07-28 MED ORDER — AZITHROMYCIN 250 MG PO TABS: 250.0000 mg | ORAL_TABLET | Freq: Every day | ORAL | 0 refills | Status: DC

## 2016-07-28 MED ORDER — ONDANSETRON 4 MG PO TBDP: 4.0000 mg | ORAL_TABLET | Freq: Three times a day (TID) | ORAL | 0 refills | Status: DC | PRN

## 2016-07-28 MED FILL — ONDANSETRON ODT 4 MG TABLET: 4 | 7 days supply | Qty: 20 | Fill #0

## 2016-07-28 MED FILL — AZITHROMYCIN 250 MG TABLET: 250 | 5 days supply | Qty: 6 | Fill #0

## 2016-07-28 NOTE — ED Triage Notes (Signed)
Cough x 3 days. Yellow sputum. States he has pink eye.

## 2016-07-28 NOTE — ED Provider Notes (Signed)
[facility]   CSN: [id number] Arrival date & time: 07/28/16  1343     History   Chief Complaint Chief Complaint  Patient presents with  . Cough    HPI Evan RichesChristopher A Kim is a 51 y.o. male.  HPI   Evan Kim is a 51 y.o. male, with a history of HTN and GERD, presenting to the ED with productive cough with yellow sputum worsening over past 4 days. Patient has a history of pneumonia on multiple occasions and states this feels similar. Endorses fever at home with Tmax 101, N/V/D, and nasal congestion. One episode each of vomiting and diarrhea. Also endorses left eye redness, itching, and tearing for the past couple days. Denies SOB, CP, rashes, or any other complaints.    Past Medical History:  Diagnosis Date  . Back pain, chronic   . Hypertension   . 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   . Reflux   . Ulcer disease     Patient Active Problem List   Diagnosis Date Noted  . Back pain 01/05/2015  . Essential hypertension 01/05/2015  . GERD (gastroesophageal reflux disease) 01/05/2015  . Chronic pain 01/05/2015  . Viral syndrome 03/10/2013    Past Surgical History:  Procedure Laterality Date  . ANTERIOR CRUCIATE LIGAMENT REPAIR     bilateral knees  . BACK SURGERY    . TOTAL KNEE ARTHROPLASTY         Home Medications    Prior to Admission medications   Medication Sig Start Date End Date Taking? Authorizing Provider  acetaminophen (TYLENOL) 500 MG tablet Take 500 mg by mouth every 6 (six) hours as needed for mild pain.   Yes [provider]  cyclobenzaprine (FLEXERIL) 5 MG tablet Take 10 mg by mouth 3 (three) times daily as needed for muscle spasms.   Yes [provider]  EPINEPHrine 0.3 mg/0.3 mL IJ SOAJ injection Inject 0.3 mg into the muscle once.   Yes [provider]  esomeprazole (NEXIUM) 20 MG capsule Take 20 mg by mouth daily before breakfast.   Yes [provider]  GABAPENTIN  PO Take by mouth.   Yes [provider]  HYDROcodone-acetaminophen (NORCO/VICODIN) 5-325 MG tablet Take 1 tablet by mouth every 6 (six) hours as needed for moderate pain.   Yes [provider]  ibuprofen (ADVIL,MOTRIN) 800 MG tablet Take 1 tablet (800 mg total) by mouth 3 (three) times daily. 01/23/16  Yes Charlynne PanderYao, David Hsienta, MD  azithromycin (ZITHROMAX) 250 MG tablet Take 1 tablet (250 mg total) by mouth daily. Take first 2 tablets together, then 1 every day until finished. 07/28/16   [name], Shawn C, PA-C  ondansetron (ZOFRAN ODT) 4 MG disintegrating tablet Take 1 tablet (4 mg total) by mouth every 8 (eight) hours as needed for nausea or vomiting. 07/28/16   [name], Hillard DankerShawn C, PA-C    Family History Family History  Problem Relation Age of Onset  . Migraines Mother   . Osteoarthritis Brother   . Heart attack Maternal Grandmother     Social History Social History  Substance Use Topics  . Smoking status: Never Smoker  . Smokeless tobacco: Never Used  . Alcohol use No     Allergies   Bee venom and Oxycodone   Review of Systems Review of Systems  Constitutional: Positive for fever.  HENT: Positive for congestion. Negative for trouble swallowing.   Eyes: Positive for redness and itching. Negative for  discharge and visual disturbance.  Respiratory: Positive for cough. Negative for shortness of breath.   Cardiovascular: Negative for chest pain.  Gastrointestinal: Positive for diarrhea, nausea and vomiting. Negative for abdominal pain and blood in stool.  Neurological: Negative for dizziness, weakness, light-headedness and headaches.  All other systems reviewed and are negative.    Physical Exam Updated Vital Signs BP (!) 104/94 (BP Location: Right Arm)   Pulse 70   Temp 99.1 F (37.3 C) (Oral)   Resp 18   Ht 6\' 2"  (1.88 m)   Wt 103.9 kg (229 lb)   SpO2 95%   BMI 29.40 kg/m   Physical Exam  Constitutional: He appears well-developed and well-nourished. No  distress.  HENT:  Head: Normocephalic and atraumatic.  Eyes: EOM are normal. Pupils are equal, round, and reactive to light. Left conjunctiva is injected.  Neck: Neck supple.  Cardiovascular: Normal rate, regular rhythm, normal heart sounds and intact distal pulses.   Pulmonary/Chest: Effort normal and breath sounds normal. No respiratory distress.  No increased work of breathing. Speaks in full sentences without difficulty.  Abdominal: Soft. There is no tenderness. There is no guarding.  Musculoskeletal: He exhibits no edema.  Lymphadenopathy:    He has no cervical adenopathy.  Neurological: He is alert.  Skin: Skin is warm and dry. He is not diaphoretic.  Psychiatric: He has a normal mood and affect. His behavior is normal.  Nursing note and vitals reviewed.    ED Treatments / Results  Labs (all labs ordered are listed, but only abnormal results are displayed) Labs Reviewed - No data to display  EKG  EKG Interpretation None       Radiology Dg Chest 2 View  Result Date: 07/28/2016 CLINICAL DATA:  Cough and fever for 4 days EXAM: CHEST  2 VIEW COMPARISON:  January 23, 2016 FINDINGS: There is no edema or consolidation. The heart size and pulmonary vascularity are normal. No adenopathy. No bone lesions. IMPRESSION: No edema or consolidation. Electronically Signed   By: Bretta Bang III M.D.   On: 07/28/2016 14:09    Procedures Procedures (including critical care time)  Medications Ordered in ED Medications - No data to display   Initial Impression / Assessment and Plan / ED Course  I have reviewed the triage vital signs and the nursing notes.  Pertinent labs & imaging results that were available during my care of the patient were reviewed by me and considered in my medical decision making (see chart for details).     Patient presents with cough, congestion, fever, and eye irritation. Patient has a suspicion for developing pneumonia based on experience. No  consolidation noted on CXR, however, this does not exclude a developing pneumonia. ABX initiated. Suspect eye irritation to be most likely viral conjunctivitis. The patient was given instructions for home care as well as return precautions. Patient voices understanding of these instructions, accepts the plan, and is comfortable with discharge.    Final Clinical Impressions(s) / ED Diagnoses   Final diagnoses:  Cough    New Prescriptions Discharge Medication List as of 07/28/2016  3:37 PM    START taking these medications   Details  azithromycin (ZITHROMAX) 250 MG tablet Take 1 tablet (250 mg total) by mouth daily. Take first 2 tablets together, then 1 every day until finished., Starting Mon 07/28/2016, Print    ondansetron (ZOFRAN ODT) 4 MG disintegrating tablet Take 1 tablet (4 mg total) by mouth every 8 (eight) hours as needed for nausea or  vomiting., Starting Mon 07/28/2016, Print         [name], [place] C, PA-C 07/30/16 1052    Vanetta Mulders, MD 08/07/16 [phone]

## 2016-07-28 NOTE — Discharge Instructions (Signed)
Your symptoms are consistent with a viral illness, but a bacterial infection may have developed. Please take all of your antibiotics until finished!   You may develop abdominal discomfort or diarrhea from the antibiotic.  You may help offset this with probiotics which you can buy or get in yogurt. Do not eat or take the probiotics until 2 hours after your antibiotic. Treatment is otherwise symptomatic care and it is important to note that these symptoms may last for 7-14 days.   Hand washing: Wash your hands throughout the day, but especially before and after touching the face, using the restroom, sneezing, coughing, or touching surfaces that have been coughed or sneezed upon. Hydration: Symptoms will be intensified and complicated by dehydration. Dehydration can also extend the duration of symptoms. Drink plenty of fluids and get plenty of rest. You should be drinking at least half a liter of water an hour to stay hydrated. Electrolyte drinks are also encouraged. You should be drinking enough fluids to make your urine light yellow, almost clear. If this is not the case, you are not drinking enough water. Please note that some of the treatments indicated below will not be effective if you are not adequately hydrated. Pain or fever: Ibuprofen, Naproxen, or Tylenol for pain or fever.  Nausea/vomiting: Use the Zofran for nausea or vomiting.  Congestion: Plain Mucinex may help relieve congestion. Saline sinus rinses and saline nasal sprays may also help relieve congestion.  Sore throat: Warm liquids or Chloraseptic spray may help soothe a sore throat. Gargle twice a day with a salt water solution made from a half teaspoon of salt in a cup of warm water.  Follow up: Follow up with a primary care provider, as needed, for any future management of this issue.

## 2016-07-28 NOTE — ED Notes (Signed)
ED Provider at bedside. 

## 2017-08-24 ENCOUNTER — Other Ambulatory Visit: Payer: Self-pay

## 2017-08-24 ENCOUNTER — Encounter (HOSPITAL_BASED_OUTPATIENT_CLINIC_OR_DEPARTMENT_OTHER): Payer: Self-pay | Admitting: *Deleted

## 2017-08-24 ENCOUNTER — Emergency Department (HOSPITAL_BASED_OUTPATIENT_CLINIC_OR_DEPARTMENT_OTHER): Payer: Medicaid Other

## 2017-08-24 ENCOUNTER — Emergency Department (HOSPITAL_BASED_OUTPATIENT_CLINIC_OR_DEPARTMENT_OTHER)
Admission: EM | Admit: 2017-08-24 | Discharge: 2017-08-24 | Disposition: A | Discharge status: Home / Self Care | Payer: Medicaid Other | Attending: Emergency Medicine | Admitting: Emergency Medicine

## 2017-08-24 DIAGNOSIS — Y929 Unspecified place or not applicable: Secondary | ICD-10-CM | POA: Insufficient documentation

## 2017-08-24 DIAGNOSIS — I10 Essential (primary) hypertension: Secondary | ICD-10-CM | POA: Insufficient documentation

## 2017-08-24 DIAGNOSIS — X58XXXA Exposure to other specified factors, initial encounter: Secondary | ICD-10-CM | POA: Insufficient documentation

## 2017-08-24 DIAGNOSIS — Y999 Unspecified external cause status: Secondary | ICD-10-CM | POA: Insufficient documentation

## 2017-08-24 DIAGNOSIS — Y9389 Activity, other specified: Secondary | ICD-10-CM | POA: Diagnosis not present

## 2017-08-24 DIAGNOSIS — T189XXA Foreign body of alimentary tract, part unspecified, initial encounter: Secondary | ICD-10-CM | POA: Diagnosis not present

## 2017-08-24 DIAGNOSIS — R109 Unspecified abdominal pain: Secondary | ICD-10-CM | POA: Diagnosis present

## 2017-08-24 DIAGNOSIS — Z79899 Other long term (current) drug therapy: Secondary | ICD-10-CM | POA: Diagnosis not present

## 2017-08-24 LAB — COMPREHENSIVE METABOLIC PANEL
ALBUMIN: 4 g/dL (ref 3.5–5.0)
ALT: 33 U/L (ref 0–44)
ANION GAP: 6 (ref 5–15)
AST: 25 U/L (ref 15–41)
Alkaline Phosphatase: 65 U/L (ref 38–126)
BUN: 18 mg/dL (ref 6–20)
CO2: 27 mmol/L (ref 22–32)
Calcium: 9 mg/dL (ref 8.9–10.3)
Chloride: 104 mmol/L (ref 98–111)
Creatinine, Ser: 1.02 mg/dL (ref 0.61–1.24)
GFR calc Af Amer: >60 mL/min (ref >60)
GFR calc non Af Amer: >60 mL/min (ref >60)
GLUCOSE: 100 mg/dL — AB (ref 70–99)
Potassium: 4.6 mmol/L (ref 3.5–5.1)
SODIUM: 137 mmol/L (ref 135–145)
Total Bilirubin: 0.4 mg/dL (ref 0.3–1.2)
Total Protein: 7.2 g/dL (ref 6.5–8.1)

## 2017-08-24 LAB — CBC
HCT: 42.3 % (ref 39.0–52.0)
HEMOGLOBIN: 14.4 g/dL (ref 13.0–17.0)
MCH: 29.9 pg (ref 26.0–34.0)
MCHC: 34 g/dL (ref 30.0–36.0)
MCV: 87.9 fL (ref 78.0–100.0)
Platelets: 257 10*3/uL (ref 150–400)
RBC: 4.81 MIL/uL (ref 4.22–5.81)
RDW: 14.8 % (ref 11.5–15.5)
WBC: 7.6 10*3/uL (ref 4.0–10.5)

## 2017-08-24 LAB — LIPASE, BLOOD: Lipase: 38 U/L (ref 11–51)

## 2017-08-24 NOTE — Discharge Instructions (Addendum)
X-rays and labs here without any acute abnormalities.  Return for fevers persistent vomiting worse abdominal pain.  The odds are that the plastic that was ingested will go through your gastrointestinal tract without any abnormalities.

## 2017-08-24 NOTE — ED Notes (Signed)
Pt. Reports he just wants to make sure he has no metal in his stomach

## 2017-08-24 NOTE — ED Triage Notes (Signed)
States he drank something with plastic shavings from a broken blender. C.o abdominal pain.
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
## 2017-09-24 HISTORY — PX: CARDIAC CATHETERIZATION: SHX172

## 2020-11-25 ENCOUNTER — Other Ambulatory Visit: Payer: Self-pay

## 2020-11-25 ENCOUNTER — Emergency Department (HOSPITAL_COMMUNITY): Payer: BC Managed Care – PPO

## 2020-11-25 ENCOUNTER — Encounter (HOSPITAL_COMMUNITY): Payer: Self-pay | Admitting: Emergency Medicine

## 2020-11-25 ENCOUNTER — Emergency Department (HOSPITAL_COMMUNITY)
Admission: EM | Admit: 2020-11-25 | Discharge: 2020-11-25 | Disposition: A | Discharge status: Home / Self Care | Payer: BC Managed Care – PPO | Attending: Emergency Medicine | Admitting: Emergency Medicine

## 2020-11-25 DIAGNOSIS — T63301A Toxic effect of unspecified spider venom, accidental (unintentional), initial encounter: Secondary | ICD-10-CM | POA: Diagnosis not present

## 2020-11-25 DIAGNOSIS — R202 Paresthesia of skin: Secondary | ICD-10-CM | POA: Insufficient documentation

## 2020-11-25 DIAGNOSIS — M545 Low back pain, unspecified: Secondary | ICD-10-CM | POA: Insufficient documentation

## 2020-11-25 DIAGNOSIS — I10 Essential (primary) hypertension: Secondary | ICD-10-CM | POA: Insufficient documentation

## 2020-11-25 DIAGNOSIS — Z96659 Presence of unspecified artificial knee joint: Secondary | ICD-10-CM | POA: Insufficient documentation

## 2020-11-25 DIAGNOSIS — S169XXA Unspecified injury of muscle, fascia and tendon at neck level, initial encounter: Secondary | ICD-10-CM | POA: Diagnosis present

## 2020-11-25 DIAGNOSIS — S1096XA Insect bite of unspecified part of neck, initial encounter: Secondary | ICD-10-CM | POA: Insufficient documentation

## 2020-11-25 DIAGNOSIS — Y92410 Unspecified street and highway as the place of occurrence of the external cause: Secondary | ICD-10-CM | POA: Insufficient documentation

## 2020-11-25 MED ORDER — LIDOCAINE 5 % EX PTCH: 3.0000 | MEDICATED_PATCH | CUTANEOUS | 0 refills | Status: DC

## 2020-11-25 MED ORDER — CYCLOBENZAPRINE HCL 5 MG PO TABS: 10.0000 mg | ORAL_TABLET | Freq: Three times a day (TID) | ORAL | 0 refills | Status: DC | PRN
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
## 2020-11-25 NOTE — Discharge Instructions (Addendum)
Your x-rays do not show any sign of fracture today.  You also do not have any bleeding in your brain.  You do have degenerative disc disease in your neck as well.  It is at the level of C6-C7.  You may follow-up with your primary care provider or an orthopedist about your pain if it continues.  There is both an orthopedic office and sports medicine office attached to your discharge papers free to call if needed. I am sending you Flexeril and lidocaine patches to your pharmacy.   It was a pleasure to meet you today and I hope that you feel better.

## 2020-11-29 ENCOUNTER — Ambulatory Visit: Payer: BC Managed Care – PPO | Admitting: Family Medicine

## 2020-11-29 NOTE — Progress Notes (Deleted)
  Evan Kim - 55 y.o. male MRN [id number]  Date of birth: 09-Nov-1965  SUBJECTIVE:  Including CC & ROS.  No chief complaint on file.   KARINA LENDERMAN is a 55 y.o. male that is  ***.  ***   Review of Systems See HPI   HISTORY: Past Medical, Surgical, Social, and Family History Reviewed & Updated per EMR.   Pertinent Historical Findings include:  Past Medical History:  Diagnosis Date  . Back pain, chronic   . Hypertension   . 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   . Reflux   . Ulcer disease     Past Surgical History:  Procedure Laterality Date  . ANTERIOR CRUCIATE LIGAMENT REPAIR     bilateral knees  . BACK SURGERY    . TOTAL KNEE ARTHROPLASTY      Family History  Problem Relation Age of Onset  . Migraines Mother   . Osteoarthritis Brother   . Heart attack Maternal Grandmother     Social History   Socioeconomic History  . Marital status: Single    Spouse name: Not on file  . Number of children: Not on file  . Years of education: Not on file  . Highest education level: Not on file  Occupational History  . Not on file  Tobacco Use  . Smoking status: Never  . Smokeless tobacco: Never  Substance and Sexual Activity  . Alcohol use: No  . Drug use: No  . Sexual activity: Not on file  Other Topics Concern  . Not on file  Social History Narrative  . Not on file   Social Determinants of Health   Financial Resource Strain: Not on file  Food Insecurity: Not on file  Transportation Needs: Not on file  Physical Activity: Not on file  Stress: Not on file  Social Connections: Not on file  Intimate Partner Violence: Not on file     PHYSICAL EXAM:  VS: There were no vitals taken for this visit. Physical Exam Gen: NAD, alert, cooperative with exam, well-appearing MSK:  ***      ASSESSMENT & PLAN:   No problem-specific Assessment & Plan notes found for this encounter.
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
## 2022-03-11 ENCOUNTER — Other Ambulatory Visit: Payer: Self-pay | Admitting: Neurological Surgery

## 2022-03-12 NOTE — Progress Notes (Signed)
Surgical Instructions    Your procedure is scheduled on Thursday, February 8.  Report to [facility] Main Entrance "A" at 8:40 A.M., then check in with the Admitting office.  Call this number if you have problems the morning of surgery:  [phone]   If you have any questions prior to your surgery date call [phone]: Open Monday-Friday 8am-4pm If you experience any cold or flu symptoms such as cough, fever, chills, shortness of breath, etc. between now and your scheduled surgery, please notify us at the above number     Remember:  Do not eat after midnight the night before your surgery  You may drink clear liquids until 7:40AM the morning of your surgery.   Clear liquids allowed are: Water, Non-Citrus Juices (without pulp), Carbonated Beverages, Clear Tea, Black Coffee ONLY (NO MILK, CREAM OR POWDERED CREAMER of any kind), and Gatorade    Take these medicines the morning of surgery with A SIP OF WATER:  esomeprazole (NEXIUM)   IF NEEDED: acetaminophen (TYLENOL)  cyclobenzaprine (FLEXERIL)  HYDROcodone-acetaminophen (NORCO/VICODIN)  ondansetron (ZOFRAN ODT)   As of today, STOP taking any Aspirin (unless otherwise instructed by your surgeon) Aleve, Naproxen, Ibuprofen, Motrin, Advil, Goody's, BC's, all herbal medications, fish oil, and all vitamins.        [facility] is not responsible for any belongings or valuables.    Do NOT Smoke (Tobacco/Vaping)  24 hours prior to your procedure  If you use a CPAP at night, you may bring your mask for your overnight stay.   Contacts, glasses, hearing aids, dentures or partials may not be worn into surgery, please bring cases for these belongings   For patients admitted to the hospital, discharge time will be determined by your treatment team.   Patients discharged the day of surgery will not be allowed to drive home, and someone needs to stay with them for 24 hours.   SURGICAL WAITING ROOM VISITATION Patients having surgery or a  procedure may have no more than 2 support people in the waiting area - these visitors may rotate.   Children under the age of 38 must have an adult with them who is not the patient. If the patient needs to stay at the hospital during part of their recovery, the visitor guidelines for inpatient rooms apply. Pre-op nurse will coordinate an appropriate time for 1 support person to accompany patient in pre-op.  This support person may not rotate.   Please refer to [email or web address][employer]hu for the visitor guidelines for Inpatients (after your surgery is over and you are in a regular room).    Special instructions:    Oral Hygiene is also important to reduce your risk of infection.  Remember - BRUSH YOUR TEETH THE MORNING OF SURGERY WITH YOUR REGULAR TOOTHPASTE   [facility]- Preparing For Surgery  Before surgery, you can play an important role. Because skin is not sterile, your skin needs to be as free of germs as possible. You can reduce the number of germs on your skin by washing with CHG (chlorahexidine gluconate) Soap before surgery.  CHG is an antiseptic cleaner which kills germs and bonds with the skin to continue killing germs even after washing.     Please do not use if you have an allergy to CHG or antibacterial soaps. If your skin becomes reddened/irritated stop using the CHG.  Do not shave (including legs and underarms) for at least 48 hours prior to first CHG shower. It is OK to shave your face.  Please follow these instructions carefully.     Shower the NIGHT BEFORE SURGERY and the MORNING OF SURGERY with CHG Soap.   If you chose to wash your hair, wash your hair first as usual with your normal shampoo. After you shampoo, rinse your hair and body thoroughly to remove the shampoo.  Then [employer] and genitals (private parts) with your normal soap and rinse thoroughly to remove soap.  After that Use CHG Soap as you would any other liquid  soap. You can apply CHG directly to the skin and wash gently with a scrungie or a clean washcloth.   Apply the CHG Soap to your body ONLY FROM THE NECK DOWN.  Do not use on open wounds or open sores. Avoid contact with your eyes, ears, mouth and genitals (private parts). Wash Face and genitals (private parts)  with your normal soap.   Wash thoroughly, paying special attention to the area where your surgery will be performed.  Thoroughly rinse your body with warm water from the neck down.  DO NOT shower/wash with your normal soap after using and rinsing off the CHG Soap.  Pat yourself dry with a CLEAN TOWEL.  Wear CLEAN PAJAMAS to bed the night before surgery  Place CLEAN SHEETS on your bed the night before your surgery  DO NOT SLEEP WITH PETS.   Day of Surgery:  Take a shower with CHG soap. Wear Clean/Comfortable clothing the morning of surgery Do not wear jewelry or makeup. Do not wear lotions, powders, perfumes/cologne or deodorant. Do not shave 48 hours prior to surgery.  Men may shave face and neck. Do not bring valuables to the hospital. Do not wear nail polish, gel polish, artificial nails, or any other type of covering on natural nails (fingers and toes) If you have artificial nails or gel coating that need to be removed by a nail salon, please have this removed prior to surgery. Artificial nails or gel coating may interfere with anesthesia's ability to adequately monitor your vital signs. Remember to brush your teeth WITH YOUR REGULAR TOOTHPASTE.    If you received a COVID test during your pre-op visit, it is requested that you wear a mask when out in public, stay away from anyone that may not be feeling well, and notify your surgeon if you develop symptoms. If you have been in contact with anyone that has tested positive in the last 10 days, please notify your surgeon.    Please read over the following fact sheets that you were given.

## 2022-03-13 ENCOUNTER — Encounter (HOSPITAL_COMMUNITY): Payer: Self-pay

## 2022-03-13 ENCOUNTER — Other Ambulatory Visit: Payer: Self-pay

## 2022-03-13 ENCOUNTER — Encounter (HOSPITAL_COMMUNITY)
Admission: RE | Admit: 2022-03-13 | Discharge: 2022-03-13 | Disposition: A | Discharge status: Home / Self Care | Payer: Medicaid Other | Source: Ambulatory Visit | Attending: Neurological Surgery | Admitting: Neurological Surgery

## 2022-03-13 VITALS — BP 128/97 | HR 64 | Temp 98.1°F | Resp 18 | Ht 75.0 in | Wt 245.1 lb

## 2022-03-13 DIAGNOSIS — I10 Essential (primary) hypertension: Secondary | ICD-10-CM

## 2022-03-13 DIAGNOSIS — K219 Gastro-esophageal reflux disease without esophagitis: Secondary | ICD-10-CM | POA: Diagnosis not present

## 2022-03-13 DIAGNOSIS — Z01818 Encounter for other preprocedural examination: Secondary | ICD-10-CM

## 2022-03-13 DIAGNOSIS — Z01812 Encounter for preprocedural laboratory examination: Secondary | ICD-10-CM | POA: Diagnosis present

## 2022-03-13 DIAGNOSIS — M5010 Cervical disc disorder with radiculopathy, unspecified cervical region: Secondary | ICD-10-CM | POA: Diagnosis not present

## 2022-03-13 HISTORY — DX: Gastro-esophageal reflux disease without esophagitis: K21.9

## 2022-03-13 HISTORY — DX: Unspecified osteoarthritis, unspecified site: M19.90

## 2022-03-13 LAB — CBC
HCT: 44.9 % (ref 39.0–52.0)
Hemoglobin: 14.8 g/dL (ref 13.0–17.0)
MCH: 28.8 pg (ref 26.0–34.0)
MCHC: 33 g/dL (ref 30.0–36.0)
MCV: 87.4 fL (ref 80.0–100.0)
Platelets: 273 10*3/uL (ref 150–400)
RBC: 5.14 MIL/uL (ref 4.22–5.81)
RDW: 14.4 % (ref 11.5–15.5)
WBC: 6.7 10*3/uL (ref 4.0–10.5)
nRBC: 0 % (ref 0.0–0.2)

## 2022-03-13 LAB — TYPE AND SCREEN
ABO/RH(D): O POS
Antibody Screen: NEGATIVE

## 2022-03-13 LAB — BASIC METABOLIC PANEL
Anion gap: 3 — ABNORMAL LOW (ref 5–15)
BUN: 11 mg/dL (ref 6–20)
CO2: 28 mmol/L (ref 22–32)
Calcium: 9.2 mg/dL (ref 8.9–10.3)
Chloride: 104 mmol/L (ref 98–111)
Creatinine, Ser: 1.08 mg/dL (ref 0.61–1.24)
GFR, Estimated: >60 mL/min (ref >60)
Glucose, Bld: 82 mg/dL (ref 70–99)
Potassium: 3.6 mmol/L (ref 3.5–5.1)
Sodium: 135 mmol/L (ref 135–145)

## 2022-03-13 LAB — SURGICAL PCR SCREEN
MRSA, PCR: NEGATIVE
Staphylococcus aureus: POSITIVE — AB

## 2022-03-13 NOTE — Progress Notes (Signed)
PCP - Dr. Val Riles Cardiologist - denies  PPM/ICD - denies   Chest x-ray - 09/18/17 EKG - 01/12/22- records requested Stress Test - denies ECHO - denies Cardiac Cath - 09/24/17  Sleep Study - 3-4 years ago per pt, OSA+ CPAP - denies  DM- denies    ASA/Blood Thinner Instructions: n/a   ERAS Protcol - yes, no drink   COVID TEST- n/a   Anesthesia review: yes, records req.  Patient denies shortness of breath, fever, cough and chest pain at PAT appointment   All instructions explained to the patient, with a verbal understanding of the material. Patient agrees to go over the instructions while at home for a better understanding.  The opportunity to ask questions was provided.

## 2022-03-14 ENCOUNTER — Encounter (HOSPITAL_COMMUNITY): Payer: Self-pay
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
## 2022-03-14 NOTE — Anesthesia Preprocedure Evaluation (Signed)
Anesthesia Evaluation  Patient identified by MRN, date of birth, ID band Patient awake    Reviewed: Allergy & Precautions, NPO status , Patient's Chart, lab work & pertinent test results  Airway Mallampati: I  TM Distance: >3 FB Neck ROM: Full    Dental no notable dental hx. (+) Dental Advisory Given, Teeth Intact   Pulmonary neg pulmonary ROS   Pulmonary exam normal breath sounds clear to auscultation       Cardiovascular hypertension, Normal cardiovascular exam Rhythm:Regular Rate:Normal     Neuro/Psych negative neurological ROS     GI/Hepatic Neg liver ROS,GERD  Medicated,,  Endo/Other  negative endocrine ROS    Renal/GU negative Renal ROS     Musculoskeletal  (+) Arthritis ,    Abdominal   Peds  Hematology negative hematology ROS (+)   Anesthesia Other Findings   Reproductive/Obstetrics                             Anesthesia Physical Anesthesia Plan  ASA: 2  Anesthesia Plan: General   Post-op Pain Management: Tylenol PO (pre-op)* and Gabapentin PO (pre-op)*   Induction: Intravenous  PONV Risk Score and Plan: 3 and Ondansetron, Dexamethasone, Treatment may vary due to age or medical condition and Midazolam  Airway Management Planned: Oral ETT and Video Laryngoscope Planned  Additional Equipment: ClearSight  Intra-op Plan:   Post-operative Plan: Extubation in OR  Informed Consent: I have reviewed the patients History and Physical, chart, labs and discussed the procedure including the risks, benefits and alternatives for the proposed anesthesia with the patient or authorized representative who has indicated his/her understanding and acceptance.     Dental advisory given  Plan Discussed with: CRNA  Anesthesia Plan Comments: (PAT note written 03/14/2022 by Myra Gianotti, PA-C.  )       Anesthesia Quick Evaluation

## 2022-03-20 ENCOUNTER — Other Ambulatory Visit: Payer: Self-pay

## 2022-03-20 ENCOUNTER — Observation Stay (HOSPITAL_COMMUNITY)
Admission: RE | Admit: 2022-03-20 | Discharge: 2022-03-21 | Disposition: A | Discharge status: Home / Self Care | Payer: Medicaid Other | Attending: Neurological Surgery | Admitting: Neurological Surgery

## 2022-03-20 ENCOUNTER — Ambulatory Visit (HOSPITAL_COMMUNITY): Payer: Medicaid Other

## 2022-03-20 ENCOUNTER — Ambulatory Visit (HOSPITAL_COMMUNITY): Payer: Medicaid Other | Admitting: Physician Assistant

## 2022-03-20 ENCOUNTER — Encounter (HOSPITAL_COMMUNITY): Payer: Self-pay | Admitting: Neurological Surgery

## 2022-03-20 ENCOUNTER — Encounter (HOSPITAL_COMMUNITY)
Admission: RE | Disposition: A | Discharge status: Home / Self Care | Payer: Self-pay | Source: Home / Self Care | Attending: Neurological Surgery

## 2022-03-20 ENCOUNTER — Ambulatory Visit (HOSPITAL_BASED_OUTPATIENT_CLINIC_OR_DEPARTMENT_OTHER): Payer: Medicaid Other | Admitting: Physician Assistant

## 2022-03-20 DIAGNOSIS — M5412 Radiculopathy, cervical region: Principal | ICD-10-CM | POA: Diagnosis present

## 2022-03-20 DIAGNOSIS — I10 Essential (primary) hypertension: Secondary | ICD-10-CM | POA: Diagnosis not present

## 2022-03-20 HISTORY — PX: ANTERIOR CERVICAL DECOMP/DISCECTOMY FUSION: SHX1161

## 2022-03-20 LAB — ABO/RH: ABO/RH(D): O POS

## 2022-03-20 SURGERY — ANTERIOR CERVICAL DECOMPRESSION/DISCECTOMY FUSION 2 LEVELS
Anesthesia: General

## 2022-03-20 MED ORDER — PHENOL 1.4 % MT LIQD: 1.0000 | OROMUCOSAL | Status: DC | PRN

## 2022-03-20 MED ORDER — LIDOCAINE-EPINEPHRINE 1 %-1:100000 IJ SOLN
INTRAMUSCULAR | Status: DC | PRN
  Administered 2022-03-20: 10 mL

## 2022-03-20 MED ORDER — MIDAZOLAM HCL 2 MG/2ML IJ SOLN
INTRAMUSCULAR | Status: AC
  Filled 2022-03-20: qty 2

## 2022-03-20 MED ORDER — MENTHOL 3 MG MT LOZG: 1.0000 | LOZENGE | OROMUCOSAL | Status: DC | PRN

## 2022-03-20 MED ORDER — ROCURONIUM BROMIDE 10 MG/ML (PF) SYRINGE
PREFILLED_SYRINGE | INTRAVENOUS | Status: AC
  Filled 2022-03-20: qty 10

## 2022-03-20 MED ORDER — 0.9 % SODIUM CHLORIDE (POUR BTL) OPTIME
TOPICAL | Status: DC | PRN
  Administered 2022-03-20: 1000 mL

## 2022-03-20 MED ORDER — GABAPENTIN 300 MG PO CAPS
300.0000 mg | ORAL_CAPSULE | Freq: Once | ORAL | Status: AC
  Administered 2022-03-20: 300 mg via ORAL

## 2022-03-20 MED ORDER — DOCUSATE SODIUM 100 MG PO CAPS
100.0000 mg | ORAL_CAPSULE | Freq: Two times a day (BID) | ORAL | Status: DC
  Administered 2022-03-20 – 2022-03-21 (×2): 100 mg via ORAL
  Filled 2022-03-20 (×2): qty 1

## 2022-03-20 MED ORDER — ONDANSETRON HCL 4 MG PO TABS: 4.0000 mg | ORAL_TABLET | Freq: Four times a day (QID) | ORAL | Status: DC | PRN

## 2022-03-20 MED ORDER — PROPOFOL 10 MG/ML IV BOLUS
INTRAVENOUS | Status: AC
  Filled 2022-03-20: qty 20

## 2022-03-20 MED ORDER — LACTATED RINGERS IV SOLN: INTRAVENOUS | Status: DC

## 2022-03-20 MED ORDER — LIDOCAINE 2% (20 MG/ML) 5 ML SYRINGE
INTRAMUSCULAR | Status: DC | PRN
  Administered 2022-03-20: 100 mg via INTRAVENOUS

## 2022-03-20 MED ORDER — LIDOCAINE-EPINEPHRINE 1 %-1:100000 IJ SOLN
INTRAMUSCULAR | Status: AC
  Filled 2022-03-20: qty 1

## 2022-03-20 MED ORDER — CEFAZOLIN SODIUM-DEXTROSE 2-4 GM/100ML-% IV SOLN
INTRAVENOUS | Status: AC
  Filled 2022-03-20: qty 100

## 2022-03-20 MED ORDER — EPINEPHRINE 0.3 MG/0.3ML IJ SOAJ: 0.3000 mg | INTRAMUSCULAR | Status: DC | PRN

## 2022-03-20 MED ORDER — FENTANYL CITRATE (PF) 250 MCG/5ML IJ SOLN
INTRAMUSCULAR | Status: DC | PRN
  Administered 2022-03-20: 25 ug via INTRAVENOUS
  Administered 2022-03-20: 50 ug via INTRAVENOUS
  Administered 2022-03-20: 100 ug via INTRAVENOUS
  Administered 2022-03-20: 50 ug via INTRAVENOUS
  Administered 2022-03-20: 25 ug via INTRAVENOUS

## 2022-03-20 MED ORDER — SODIUM CHLORIDE 0.9 % IV SOLN: 250.0000 mL | INTRAVENOUS | Status: DC

## 2022-03-20 MED ORDER — THROMBIN 5000 UNITS EX SOLR: OROMUCOSAL | Status: DC | PRN

## 2022-03-20 MED ORDER — ONDANSETRON HCL 4 MG/2ML IJ SOLN
INTRAMUSCULAR | Status: AC
  Filled 2022-03-20: qty 2

## 2022-03-20 MED ORDER — GABAPENTIN 300 MG PO CAPS
ORAL_CAPSULE | ORAL | Status: AC
  Filled 2022-03-20: qty 1

## 2022-03-20 MED ORDER — PROPOFOL 10 MG/ML IV BOLUS
INTRAVENOUS | Status: DC | PRN
  Administered 2022-03-20: 200 mg via INTRAVENOUS

## 2022-03-20 MED ORDER — MIDAZOLAM HCL 5 MG/5ML IJ SOLN
INTRAMUSCULAR | Status: DC | PRN
  Administered 2022-03-20: 2 mg via INTRAVENOUS

## 2022-03-20 MED ORDER — SUGAMMADEX SODIUM 200 MG/2ML IV SOLN
INTRAVENOUS | Status: DC | PRN
  Administered 2022-03-20: 218.6 mg via INTRAVENOUS

## 2022-03-20 MED ORDER — DEXAMETHASONE SODIUM PHOSPHATE 10 MG/ML IJ SOLN
INTRAMUSCULAR | Status: DC | PRN
  Administered 2022-03-20: 10 mg via INTRAVENOUS

## 2022-03-20 MED ORDER — AMISULPRIDE (ANTIEMETIC) 5 MG/2ML IV SOLN: 10.0000 mg | Freq: Once | INTRAVENOUS | Status: DC | PRN

## 2022-03-20 MED ORDER — ACETAMINOPHEN 500 MG PO TABS: 1000.0000 mg | ORAL_TABLET | Freq: Once | ORAL | Status: AC

## 2022-03-20 MED ORDER — POLYETHYLENE GLYCOL 3350 17 G PO PACK: 17.0000 g | PACK | Freq: Every day | ORAL | Status: DC | PRN

## 2022-03-20 MED ORDER — THROMBIN 5000 UNITS EX SOLR
CUTANEOUS | Status: AC
  Filled 2022-03-20: qty 5000

## 2022-03-20 MED ORDER — CYCLOBENZAPRINE HCL 10 MG PO TABS
10.0000 mg | ORAL_TABLET | Freq: Three times a day (TID) | ORAL | Status: DC | PRN
  Administered 2022-03-20 – 2022-03-21 (×2): 10 mg via ORAL
  Filled 2022-03-20 (×2): qty 1

## 2022-03-20 MED ORDER — HYDROMORPHONE HCL 1 MG/ML IJ SOLN
0.2500 mg | INTRAMUSCULAR | Status: DC | PRN
  Administered 2022-03-20 (×2): 0.5 mg via INTRAVENOUS

## 2022-03-20 MED ORDER — PROMETHAZINE HCL 25 MG/ML IJ SOLN: 6.2500 mg | INTRAMUSCULAR | Status: DC | PRN

## 2022-03-20 MED ORDER — MEPERIDINE HCL 25 MG/ML IJ SOLN: 6.2500 mg | INTRAMUSCULAR | Status: DC | PRN

## 2022-03-20 MED ORDER — CEFAZOLIN SODIUM-DEXTROSE 2-4 GM/100ML-% IV SOLN
2.0000 g | Freq: Three times a day (TID) | INTRAVENOUS | Status: AC
  Administered 2022-03-20 – 2022-03-21 (×2): 2 g via INTRAVENOUS
  Filled 2022-03-20 (×2): qty 100

## 2022-03-20 MED ORDER — PANTOPRAZOLE SODIUM 40 MG PO TBEC
40.0000 mg | DELAYED_RELEASE_TABLET | Freq: Every day | ORAL | Status: DC
  Administered 2022-03-21: 40 mg via ORAL
  Filled 2022-03-20: qty 1

## 2022-03-20 MED ORDER — CHLORHEXIDINE GLUCONATE 0.12 % MT SOLN: 15.0000 mL | Freq: Once | OROMUCOSAL | Status: AC

## 2022-03-20 MED ORDER — CHLORHEXIDINE GLUCONATE 0.12 % MT SOLN
OROMUCOSAL | Status: AC
  Administered 2022-03-20: 15 mL via OROMUCOSAL
  Filled 2022-03-20: qty 15

## 2022-03-20 MED ORDER — ROCURONIUM BROMIDE 10 MG/ML (PF) SYRINGE
PREFILLED_SYRINGE | INTRAVENOUS | Status: DC | PRN
  Administered 2022-03-20 (×3): 30 mg via INTRAVENOUS
  Administered 2022-03-20: 70 mg via INTRAVENOUS

## 2022-03-20 MED ORDER — HYDROMORPHONE HCL 1 MG/ML IJ SOLN
1.0000 mg | INTRAMUSCULAR | Status: DC | PRN
  Administered 2022-03-20: 1 mg via INTRAVENOUS
  Filled 2022-03-20: qty 1

## 2022-03-20 MED ORDER — HYDROMORPHONE HCL 1 MG/ML IJ SOLN
INTRAMUSCULAR | Status: AC
  Filled 2022-03-20: qty 0.5

## 2022-03-20 MED ORDER — ACETAMINOPHEN 325 MG PO TABS: 650.0000 mg | ORAL_TABLET | ORAL | Status: DC | PRN

## 2022-03-20 MED ORDER — ORAL CARE MOUTH RINSE: 15.0000 mL | Freq: Once | OROMUCOSAL | Status: AC

## 2022-03-20 MED ORDER — ONDANSETRON HCL 4 MG/2ML IJ SOLN: 4.0000 mg | Freq: Four times a day (QID) | INTRAMUSCULAR | Status: DC | PRN

## 2022-03-20 MED ORDER — SODIUM CHLORIDE 0.9% FLUSH: 3.0000 mL | INTRAVENOUS | Status: DC | PRN

## 2022-03-20 MED ORDER — CHLORHEXIDINE GLUCONATE CLOTH 2 % EX PADS: 6.0000 | MEDICATED_PAD | Freq: Once | CUTANEOUS | Status: DC

## 2022-03-20 MED ORDER — FENTANYL CITRATE (PF) 250 MCG/5ML IJ SOLN
INTRAMUSCULAR | Status: AC
  Filled 2022-03-20: qty 5

## 2022-03-20 MED ORDER — ONDANSETRON HCL 4 MG/2ML IJ SOLN
INTRAMUSCULAR | Status: DC | PRN
  Administered 2022-03-20: 4 mg via INTRAVENOUS

## 2022-03-20 MED ORDER — LIDOCAINE 2% (20 MG/ML) 5 ML SYRINGE
INTRAMUSCULAR | Status: AC
  Filled 2022-03-20: qty 5

## 2022-03-20 MED ORDER — ACETAMINOPHEN 650 MG RE SUPP: 650.0000 mg | RECTAL | Status: DC | PRN

## 2022-03-20 MED ORDER — ACETAMINOPHEN 500 MG PO TABS
ORAL_TABLET | ORAL | Status: AC
  Administered 2022-03-20: 1000 mg via ORAL
  Filled 2022-03-20: qty 2

## 2022-03-20 MED ORDER — SODIUM CHLORIDE 0.9% FLUSH
3.0000 mL | Freq: Two times a day (BID) | INTRAVENOUS | Status: DC
  Administered 2022-03-20: 3 mL via INTRAVENOUS

## 2022-03-20 MED ORDER — HYDROMORPHONE HCL 1 MG/ML IJ SOLN
INTRAMUSCULAR | Status: DC | PRN
  Administered 2022-03-20: .5 mg via INTRAVENOUS

## 2022-03-20 MED ORDER — LACTATED RINGERS IV SOLN: INTRAVENOUS | Status: DC | PRN

## 2022-03-20 MED ORDER — DEXAMETHASONE SODIUM PHOSPHATE 10 MG/ML IJ SOLN
INTRAMUSCULAR | Status: AC
  Filled 2022-03-20: qty 1

## 2022-03-20 MED ORDER — HYDROCODONE-ACETAMINOPHEN 5-325 MG PO TABS
1.0000 | ORAL_TABLET | ORAL | Status: DC | PRN
  Administered 2022-03-20 – 2022-03-21 (×5): 2 via ORAL
  Filled 2022-03-20 (×5): qty 2

## 2022-03-20 MED ORDER — HYDROMORPHONE HCL 1 MG/ML IJ SOLN
INTRAMUSCULAR | Status: AC
  Filled 2022-03-20: qty 1

## 2022-03-20 MED ORDER — CEFAZOLIN SODIUM-DEXTROSE 2-4 GM/100ML-% IV SOLN
2.0000 g | INTRAVENOUS | Status: AC
  Administered 2022-03-20: 2 g via INTRAVENOUS

## 2022-03-20 SURGICAL SUPPLY — 54 items
ADH SKN CLS APL DERMABOND .7 (GAUZE/BANDAGES/DRESSINGS) ×1
BAG COUNTER SPONGE SURGICOUNT (BAG) ×2 IMPLANT
BAG SPNG CNTER NS LX DISP (BAG) ×1
BAND INSRT 18 STRL LF DISP RB (MISCELLANEOUS) ×2
BAND RUBBER #18 3X1/16 STRL (MISCELLANEOUS) ×4 IMPLANT
BLADE CLIPPER SURG (BLADE) IMPLANT
BLADE SURG 11 STRL SS (BLADE) ×2 IMPLANT
BUR MATCHSTICK NEURO 3.0 LAGG (BURR) ×2 IMPLANT
CANISTER SUCT 3000ML PPV (MISCELLANEOUS) ×2 IMPLANT
DERMABOND ADVANCED .7 DNX12 (GAUZE/BANDAGES/DRESSINGS) ×2 IMPLANT
DRAPE C-ARM 42X72 X-RAY (DRAPES) ×4 IMPLANT
DRAPE HALF SHEET 40X57 (DRAPES) IMPLANT
DRAPE LAPAROTOMY 100X72 PEDS (DRAPES) ×2 IMPLANT
DRAPE MICROSCOPE SLANT 54X150 (MISCELLANEOUS) ×2 IMPLANT
DRSG DERMACEA 8X12 NADH (GAUZE/BANDAGES/DRESSINGS) IMPLANT
DURAPREP 6ML APPLICATOR 50/CS (WOUND CARE) ×2 IMPLANT
ELECT COATED BLADE 2.86 ST (ELECTRODE) ×2 IMPLANT
ELECT REM PT RETURN 9FT ADLT (ELECTROSURGICAL) ×1
ELECTRODE REM PT RTRN 9FT ADLT (ELECTROSURGICAL) ×2 IMPLANT
GAUZE 4X4 16PLY ~~LOC~~+RFID DBL (SPONGE) IMPLANT
GLOVE BIOGEL PI IND STRL 7.5 (GLOVE) ×4 IMPLANT
GLOVE ECLIPSE 7.5 STRL STRAW (GLOVE) ×2 IMPLANT
GLOVE EXAM NITRILE LRG STRL (GLOVE) IMPLANT
GLOVE EXAM NITRILE XL STR (GLOVE) IMPLANT
GLOVE EXAM NITRILE XS STR PU (GLOVE) IMPLANT
GOWN STRL REUS W/ TWL LRG LVL3 (GOWN DISPOSABLE) ×4 IMPLANT
GOWN STRL REUS W/ TWL XL LVL3 (GOWN DISPOSABLE) IMPLANT
GOWN STRL REUS W/TWL 2XL LVL3 (GOWN DISPOSABLE) IMPLANT
GOWN STRL REUS W/TWL LRG LVL3 (GOWN DISPOSABLE) ×2
GOWN STRL REUS W/TWL XL LVL3 (GOWN DISPOSABLE)
HEMOSTAT POWDER KIT SURGIFOAM (HEMOSTASIS) ×2 IMPLANT
KIT BASIN OR (CUSTOM PROCEDURE TRAY) ×2 IMPLANT
KIT TURNOVER KIT B (KITS) ×2 IMPLANT
NDL SPNL 18GX3.5 QUINCKE PK (NEEDLE) ×2 IMPLANT
NEEDLE HYPO 22GX1.5 SAFETY (NEEDLE) ×2 IMPLANT
NEEDLE SPNL 18GX3.5 QUINCKE PK (NEEDLE) ×1 IMPLANT
NS IRRIG 1000ML POUR BTL (IV SOLUTION) ×2 IMPLANT
PACK LAMINECTOMY NEURO (CUSTOM PROCEDURE TRAY) ×2 IMPLANT
PAD ARMBOARD 7.5X6 YLW CONV (MISCELLANEOUS) ×6 IMPLANT
PIN DISTRACTION 14MM (PIN) IMPLANT
PLATE ELITE 42MM (Plate) IMPLANT
SCREW SELF TAP VAR 4.0X13 (Screw) IMPLANT
SPACER BONE CORNERSTONE 6X14 (Orthopedic Implant) IMPLANT
SPACER BONE CORNERSTONE 7X14 (Orthopedic Implant) IMPLANT
SPIKE FLUID TRANSFER (MISCELLANEOUS) ×2 IMPLANT
SPONGE INTESTINAL PEANUT (DISPOSABLE) ×2 IMPLANT
SPONGE SURGIFOAM ABS GEL SZ50 (HEMOSTASIS) IMPLANT
STAPLER VISISTAT 35W (STAPLE) IMPLANT
SUT MNCRL AB 3-0 PS2 18 (SUTURE) ×2 IMPLANT
SUT VIC AB 3-0 SH 8-18 (SUTURE) ×2 IMPLANT
TAPE CLOTH 3X10 TAN LF (GAUZE/BANDAGES/DRESSINGS) ×2 IMPLANT
TOWEL GREEN STERILE (TOWEL DISPOSABLE) ×2 IMPLANT
TOWEL GREEN STERILE FF (TOWEL DISPOSABLE) ×2 IMPLANT
WATER STERILE IRR 1000ML POUR (IV SOLUTION) ×2 IMPLANT

## 2022-03-20 NOTE — Op Note (Signed)
PATIENT: Evan Kim  PROCEDURE DATE: 03/20/22  PRE-OPERATIVE DIAGNOSIS:  Cervical radiculopathy   POST-OPERATIVE DIAGNOSIS:  Same   PROCEDURE:  C4-5, C5-6 Anterior Cervical Discectomy and Instrumented Fusion   SURGEON:  Surgeon(s) and Role:    Judith Part, MD - Primary    Norm Parcel PA  - Assisting   ANESTHESIA: ETGA   BRIEF HISTORY: This is a 57 year old man who presented with right upper extremity pain, workup showed corresponding foraminal stenosis at C4-5 and C5-6. His symptoms were refractory, I therefore recommended surgery in the form of ACDF at those levels. This was discussed with the patient as well as risks, benefits, and alternatives and the patient wished to proceed with surgical treatment.   OPERATIVE DETAIL: The patient was taken to the operating room and placed on the OR table in the supine position. A formal time out was performed with two patient identifiers and confirmed the operative site. Anesthesia was induced by the anesthesia team.  Fluoroscopy was used to localize the surgical level and an incision was marked in a skin crease. The area was then prepped and draped in a sterile fashion. A transverse linear incision was made on the right side of the neck. The platysma was divided and the sternocleidomastoid muscle was identified. The carotid sheath was palpated, identified, and retracted laterally with the sternocleidomastoid muscle. The strap muscles were identified and retracted medially and the pretracheal fascia was entered. A bent spinal needle was used with fluoroscopy to localize the surgical level after dissection. The longus colli were elevated bilaterally and a self-retaining retractor was placed. The endotracheal tube cuff balloon was deflated and reinflated after retractor placement.   Anterior osteophytes were removed until flush with the anterior vertebral body. The disc annulus was incised and a complete C4-C5 discectomy was performed.  The posterior longitudinal ligament was incised followed by ligamentous and bony removal until no central canal stenosis was present. Decompression was then taken out laterally into the bilateral foramina until no foraminal stenosis was palpable. A 17mm cortical allograft (Medtronic) was inserted into the disc space as an interbody graft.   Retractors were moved and moved caudally to C5-6, where this technique was performed in the same fashion.  An anterior plate (Medtronic) was positioned and 6, 66mm screws were used to secure the plate to the C4, C5 and C6 vertebral bodies. Hemostasis was obtained and the incision was closed in layers. All instrument and sponge counts were correct. The patient was then returned to anesthesia for emergence. No apparent complications at the completion of the procedure.   EBL:  70mL   DRAINS: none   SPECIMENS: none   Judith Part, MD 03/20/22 1:30 PM

## 2022-03-20 NOTE — Anesthesia Procedure Notes (Signed)
Procedure Name: Intubation Date/Time: 03/20/2022 11:14 AM  Performed by: Wilburn Cornelia, CRNAPre-anesthesia Checklist: Patient identified, Emergency Drugs available, Suction available and Patient being monitored Patient Re-evaluated:Patient Re-evaluated prior to induction Oxygen Delivery Method: Circle system utilized Preoxygenation: Pre-oxygenation with 100% oxygen Induction Type: IV induction Ventilation: Oral airway inserted - appropriate to patient size Laryngoscope Size: Glidescope and 4 Grade View: Grade I Tube type: Oral Tube size: 7.5 mm Number of attempts: 1 Airway Equipment and Method: Stylet, Video-laryngoscopy and Bite block Placement Confirmation: ETT inserted through vocal cords under direct vision, positive ETCO2 and breath sounds checked- equal and bilateral Secured at: 23 cm Tube secured with: Tape Dental Injury: Teeth and Oropharynx as per pre-operative assessment

## 2022-03-20 NOTE — Discharge Summary (Incomplete)
Discharge Summary  Date of Admission: 03/20/2022  Date of Discharge: 03/21/2022  Attending Physician: Emelda Brothers, MD  Hospital Course: Patient was admitted following an uncomplicated 2 level ACDF. They were recovered in PACU and transferred to [facility]. Their preop symptoms were improved, their hospital course was uncomplicated and the patient was discharged home on POD1. They will follow up in clinic with me in clinic in 2 weeks.  Neurologic exam at discharge:  Strength 5/5 x4 and SILTx4   Discharge diagnosis: Cervical radiculopathy  Judith Part, MD 03/20/22 8:47 PM

## 2022-03-20 NOTE — Anesthesia Postprocedure Evaluation (Signed)
Anesthesia Post Note  Patient: Evan Kim  Procedure(s) Performed: CERVICAL FOUR-CERVICAL FIVE, CERVICAL FIVE-CERVICAL SIX ANTERIOR CERVICAL DISCECTOMY AND FUSION     Patient location during evaluation: PACU Anesthesia Type: General Level of consciousness: sedated and patient cooperative Pain management: pain level controlled Vital Signs Assessment: post-procedure vital signs reviewed and stable Respiratory status: spontaneous breathing Cardiovascular status: stable Anesthetic complications: no   No notable events documented.  Last Vitals:  Vitals:   03/20/22 1446 03/20/22 2043  BP: (!) 157/95 (!) 161/93  Pulse: 80 80  Resp: 16   Temp: 36.7 C 36.9 C  SpO2: 98% 98%    Last Pain:  Vitals:   03/20/22 2043  TempSrc: Oral  PainSc:                  Nolon Nations

## 2022-03-20 NOTE — H&P (Signed)
Surgical H&P Update  HPI: 57 y.o. with a history of neck and right upper extremity pain. 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 No changes in health since they were last seen. Still having the above and wishes to proceed with surgery.  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    Reflux    Ulcer disease    FamHx:  Family History  Problem Relation Age of Onset   Migraines Mother    Osteoarthritis Brother    Heart attack Maternal Grandmother    SocHx:  reports that he has never smoked. He has never used smokeless tobacco. He reports that he does not drink alcohol and does not use drugs.  Physical Exam: Strength 5/5 x4 and SILTx4, no hoffman's  Assesment/Plan: 57 y.o. man with cervical radiculopathy, here for C4-5 C5-6 ACDF. Risks, benefits, and alternatives discussed and the patient would like to continue with surgery.  -OR today -3C post-op  Judith Part, MD 03/20/22 10:58 AM

## 2022-03-20 NOTE — Transfer of Care (Signed)
Immediate Anesthesia Transfer of Care Note  Patient: Evan Kim  Procedure(s) Performed: CERVICAL FOUR-CERVICAL FIVE, CERVICAL FIVE-CERVICAL SIX ANTERIOR CERVICAL DISCECTOMY AND FUSION  Patient Location: PACU  Anesthesia Type:General  Level of Consciousness: awake, alert , and oriented  Airway & Oxygen Therapy: Patient Spontanous Breathing  Post-op Assessment: Report given to RN, Post -op Vital signs reviewed and stable, Patient moving all extremities X 4, and Patient able to stick tongue midline  Post vital signs: Reviewed  Last Vitals:  Vitals Value Taken Time  BP 143/89 03/20/22 1350  Temp 98.4   Pulse 92 03/20/22 1356  Resp 15 03/20/22 1356  SpO2 97 % 03/20/22 1356  Vitals shown include unvalidated device data.  Last Pain:  Vitals:   03/20/22 0902  TempSrc:   PainSc: 10-Worst pain ever         Complications: No notable events documented.

## 2022-03-21 ENCOUNTER — Encounter (HOSPITAL_COMMUNITY): Payer: Self-pay | Admitting: Neurological Surgery

## 2022-03-21 DIAGNOSIS — M5412 Radiculopathy, cervical region: Secondary | ICD-10-CM | POA: Diagnosis not present

## 2022-03-21 MED ORDER — HYDROCODONE-ACETAMINOPHEN 5-325 MG PO TABS: 1.0000 | ORAL_TABLET | ORAL | 0 refills | Status: DC | PRN

## 2022-03-21 NOTE — Discharge Summary (Signed)
Physician Discharge Summary  Patient ID: Evan Kim MRN: [id number] DOB/AGE: Aug 01, 1965 57 y.o.  Admit date: 03/20/2022 Discharge date: 03/21/2022  Admission Diagnoses: Cervical radiculopathy   Discharge Diagnoses: Same   Discharged Condition: stable  Hospital Course: The patient was admitted on 03/20/2022 and taken to the operating room where the patient underwent ACDF. The patient tolerated the procedure well and was taken to the recovery room and then to the floor in stable condition. The hospital course was routine. There were no complications. The wound remained clean dry and intact. Pt had appropriate neck and throat soreness. No complaints of arm pain or new N/T/W. The patient remained afebrile with stable vital signs, and tolerated a regular diet. The patient continued to increase activities, and pain was well controlled with oral pain medications.   Consults: None  Significant Diagnostic Studies:  Results for orders placed or performed during the hospital encounter of 03/20/22  ABO/Rh  Result Value Ref Range   ABO/RH(D)      O POS Performed at [facility] [address], [place], [place] [address]     DG Cervical Spine 1 View  Result Date: 03/20/2022 CLINICAL DATA:  Anterior surgical fusion. EXAM: DG CERVICAL SPINE - 1 VIEW; DG C-ARM 1-60 MIN-NO REPORT Radiation exposure index: 0.54 mGy. COMPARISON:  November 25, 2020. FINDINGS: Two intraoperative fluoroscopic images were obtained of the cervical spine. These demonstrate the patient to be status post surgical anterior fusion of C4-5 and C5-6. IMPRESSION: Fluoroscopic guidance provided during surgical anterior fusion. Electronically Signed   By: Marijo Conception M.D.   On: 03/20/2022 13:51   DG C-Arm 1-60 Min-No Report  Result Date: 03/20/2022 Fluoroscopy was utilized by the requesting physician.  No radiographic interpretation.    Antibiotics:  Anti-infectives (From admission, onward)    Start     Dose/Rate  Route Frequency Ordered Stop   03/20/22 2000  ceFAZolin (ANCEF) IVPB 2g/100 mL premix        2 g 200 mL/hr over 30 Minutes Intravenous Every 8 hours 03/20/22 1455 03/21/22 0547   03/20/22 0900  ceFAZolin (ANCEF) IVPB 2g/100 mL premix        2 g 200 mL/hr over 30 Minutes Intravenous On call to O.R. 03/20/22 0848 03/20/22 1116   03/20/22 0852  ceFAZolin (ANCEF) 2-4 GM/100ML-% IVPB       Note to Pharmacy: Chriss Driver: cabinet override      03/20/22 0852 03/20/22 1132       Discharge Exam: Blood pressure (!) 140/91, pulse 73, temperature 98.5 F (36.9 C), temperature source Oral, resp. rate 18, height 6' 3"$  (1.905 m), weight 109.3 kg, SpO2 98 %. Neurologic: Grossly normal Incision looks good  Discharge Medications:   Allergies as of 03/21/2022       Reactions   Bee Venom Anaphylaxis   Oxycodone Hives        Medication List     STOP taking these medications    azithromycin 250 MG tablet Commonly known as: ZITHROMAX   cyclobenzaprine 5 MG tablet Commonly known as: FLEXERIL   ibuprofen 800 MG tablet Commonly known as: ADVIL   lidocaine 5 % Commonly known as: Lidoderm       TAKE these medications    acetaminophen 500 MG tablet Commonly known as: TYLENOL Take 1,000 mg by mouth every 6 (six) hours as needed for mild pain.   EPINEPHrine 0.3 mg/0.3 mL Soaj injection Commonly known as: EPI-PEN Inject 0.3 mg into the muscle as needed  for anaphylaxis.   esomeprazole 20 MG capsule Commonly known as: NEXIUM Take 20 mg by mouth daily before breakfast.   HYDROcodone-acetaminophen 5-325 MG tablet Commonly known as: NORCO/VICODIN Take 1-2 tablets by mouth every 4 (four) hours as needed (pain).   ondansetron 4 MG disintegrating tablet Commonly known as: Zofran ODT Take 1 tablet (4 mg total) by mouth every 8 (eight) hours as needed for nausea or vomiting.        Disposition: Home   Final Dx: ACDF  Discharge Instructions     Call MD for:  difficulty  breathing, headache or visual disturbances   Complete by: As directed    Call MD for:  persistant nausea and vomiting   Complete by: As directed    Call MD for:  redness, tenderness, or signs of infection (pain, swelling, redness, odor or green/yellow discharge around incision site)   Complete by: As directed    Call MD for:  severe uncontrolled pain   Complete by: As directed    Call MD for:  temperature >100.4   Complete by: As directed    Diet - low sodium heart healthy   Complete by: As directed    Discharge instructions   Complete by: As directed    Discharge Instructions  No restriction in activities, slowly increase your activity back to normal.   Your incision is closed with dermabond (purple glue). This will naturally fall off over the next 1-2 weeks.   Okay to shower on the day of discharge. Use regular soap and water and try to be gentle when cleaning your incision.   Follow up with Dr. Zada Finders in 2 weeks after discharge. If you do not already have a discharge appointment, please call his office at [phone] to schedule a follow up appointment. If you have any concerns or questions, please call the office and let us know.   Increase activity slowly   Complete by: As directed           Signed: Eustace Moore 03/21/2022, 9:48 AM

## 2022-03-21 NOTE — Plan of Care (Signed)

## 2022-03-21 NOTE — Evaluation (Signed)
Physical Therapy Evaluation Patient Details Name: Evan Kim MRN: [id number] DOB: 09-27-1965 Today's Date: 03/21/2022  History of Present Illness  Pt is a 57 y.o. male s/p ACDF 03/20/22. PMH: S1 mass x 3 (awaiting lumbar sx), HTN, OA, GERD  Clinical Impression  PT eval complete. Pt demonstrates mod I bed mobility and transfers. Supervision amb 300' without AD and ascend/descend 12 steps with L rail. All education complete. No further skilled PT intervention indicated. PT signing off.        Recommendations for follow up therapy are one component of a multi-disciplinary discharge planning process, led by the attending physician.  Recommendations may be updated based on patient status, additional functional criteria and insurance authorization.  Follow Up Recommendations No PT follow up      Assistance Recommended at Discharge PRN  Patient can return home with the following  Assistance with cooking/housework;Assist for transportation    Equipment Recommendations None recommended by PT  Recommendations for Other Services       Functional Status Assessment Patient has had a recent decline in their functional status and demonstrates the ability to make significant improvements in function in a reasonable and predictable amount of time.     Precautions / Restrictions Precautions Precautions: Cervical Precaution Comments: Educated on cervical precautions. Handout provided. Restrictions Other Position/Activity Restrictions: no brace      Mobility  Bed Mobility Overal bed mobility: Modified Independent                  Transfers Overall transfer level: Modified independent Equipment used: None                    Ambulation/Gait Ambulation/Gait assistance: Supervision Gait Distance (Feet): 300 Feet Assistive device: None Gait Pattern/deviations: WFL(Within Functional Limits) Gait velocity: WFL Gait velocity interpretation: >2.62 ft/sec, indicative of  community ambulatory   General Gait Details: steady gait, no LOB  Stairs Stairs: Yes Stairs assistance: Supervision Stair Management: One rail Left, Forwards, Alternating pattern Number of Stairs: 12    Wheelchair Mobility    Modified Rankin (Stroke Patients Only)       Balance Overall balance assessment: No apparent balance deficits (not formally assessed)                                           Pertinent Vitals/Pain Pain Assessment Pain Assessment: Faces Faces Pain Scale: Hurts little more Pain Location: neck Pain Descriptors / Indicators: Discomfort, Sore, Guarding, Grimacing Pain Intervention(s): Monitored during session, Repositioned    Home Living Family/patient expects to be discharged to:: Private residence Living Arrangements: Children Available Help at Discharge: Family;Available 24 hours/day Type of Home: House Home Access: Stairs to enter Entrance Stairs-Rails: None Entrance Stairs-Number of Steps: 2 Alternate Level Stairs-Number of Steps: flight Home Layout: Two level;Bed/bath upstairs Home Equipment: None      Prior Function Prior Level of Function : Independent/Modified Independent;Driving                     Hand Dominance   Dominant Hand: Right    Extremity/Trunk Assessment   Upper Extremity Assessment Upper Extremity Assessment: Defer to OT evaluation    Lower Extremity Assessment Lower Extremity Assessment: RLE deficits/detail RLE Deficits / Details: Pt reports numbness R groin wrapping around to lateral aspect of foot. Strength intact    Cervical / Trunk Assessment Cervical / Trunk Assessment:  Neck Surgery  Communication   Communication: No difficulties  Cognition Arousal/Alertness: Awake/alert Behavior During Therapy: WFL for tasks assessed/performed Overall Cognitive Status: Within Functional Limits for tasks assessed                                          General Comments       Exercises     Assessment/Plan    PT Assessment Patient does not need any further PT services  PT Problem List         PT Treatment Interventions      PT Goals (Current goals can be found in the Care Plan section)  Acute Rehab PT Goals Patient Stated Goal: home PT Goal Formulation: All assessment and education complete, DC therapy    Frequency       Co-evaluation               AM-PAC PT "6 Clicks" Mobility  Outcome Measure Help needed turning from your back to your side while in a flat bed without using bedrails?: None Help needed moving from lying on your back to sitting on the side of a flat bed without using bedrails?: None Help needed moving to and from a bed to a chair (including a wheelchair)?: None Help needed standing up from a chair using your arms (e.g., wheelchair or bedside chair)?: None Help needed to walk in hospital room?: A Little Help needed climbing 3-5 steps with a railing? : A Little 6 Click Score: 22    End of Session Equipment Utilized During Treatment: Gait belt Activity Tolerance: Patient tolerated treatment well Patient left: in bed;with call bell/phone within reach;with family/visitor present Nurse Communication: Mobility status PT Visit Diagnosis: Pain;Difficulty in walking, not elsewhere classified (R26.2)    Time: [id number] PT Time Calculation (min) (ACUTE ONLY): 13 min   Charges:   PT Evaluation $PT Eval Moderate Complexity: 1 Mod          Gloriann Loan., PT  Office # [phone]   Lorriane Shire 03/21/2022, 8:13 AM

## 2022-03-21 NOTE — Evaluation (Signed)
Occupational Therapy Evaluation/Discharge Patient Details Name: Evan Kim MRN: [id number] DOB: October 12, 1965 Today's Date: 03/21/2022   History of Present Illness Pt is a 57 y.o. male s/p ACDF 03/20/22. PMH: S1 mass x 3 (awaiting lumbar sx), HTN, OA, GERD   Clinical Impression   PTA, pt typically Independent in all daily tasks, plans to DC to his mother's home post op for initial recovery. Pt reports high pain levels though moving Independently in room. Pt declined to perform ADLs this AM with OT but participatory in education for cervical precautions and able to demonstrate functional skills needed to complete these tasks. Pt's mother present, engaged and plans to assist pt as needed at DC. No further skilled OT services needed at this time.       Recommendations for follow up therapy are one component of a multi-disciplinary discharge planning process, led by the attending physician.  Recommendations may be updated based on patient status, additional functional criteria and insurance authorization.   Follow Up Recommendations  No OT follow up     Assistance Recommended at Discharge PRN  Patient can return home with the following Assistance with cooking/housework;Assist for transportation    Functional Status Assessment  Patient has not had a recent decline in their functional status  Equipment Recommendations  None recommended by OT (pt/family may look into shower chair online if needed)    Recommendations for Other Services       Precautions / Restrictions Precautions Precautions: Cervical Precaution Booklet Issued: Yes (comment) Precaution Comments: No brace needed Restrictions Weight Bearing Restrictions: No Other Position/Activity Restrictions: no brace      Mobility Bed Mobility Overal bed mobility: Modified Independent                  Transfers Overall transfer level: Independent Equipment used: None                      Balance Overall  balance assessment: No apparent balance deficits (not formally assessed)                                         ADL either performed or assessed with clinical judgement   ADL Overall ADL's : Modified independent                                       General ADL Comments: Jumped up quickly OOB to go to bathroom to cough up phlegm without device or LOB noted. Educated re: strategies for LB dressing w/ pt able to easily cross LE to reach feet (reports he typically lays in bed to put pants on then gets up), strategies for showering and use of AE vs long handled sponge, use of shower chair, body mechanics for bed mobility and sleeping (plans to sleep in lift chair most of the time), and assist for IADLs.     Vision Ability to See in Adequate Light: 0 Adequate Patient Visual Report: No change from baseline Vision Assessment?: No apparent visual deficits     Perception     Praxis      Pertinent Vitals/Pain Pain Assessment Pain Assessment: 0-10 Pain Score: 10-Worst pain ever Pain Location: neck Pain Descriptors / Indicators: Discomfort, Sore, Guarding, Grimacing Pain Intervention(s): Monitored during session     Hand Dominance Right  Extremity/Trunk Assessment Upper Extremity Assessment Upper Extremity Assessment: Overall WFL for tasks assessed (reports R shoulder pain better, less pressure)   Lower Extremity Assessment Lower Extremity Assessment: Defer to PT evaluation RLE Deficits / Details: Pt reports numbness R groin wrapping around to lateral aspect of foot. Strength intact   Cervical / Trunk Assessment Cervical / Trunk Assessment: Neck Surgery   Communication Communication Communication: No difficulties   Cognition Arousal/Alertness: Awake/alert Behavior During Therapy: WFL for tasks assessed/performed Overall Cognitive Status: Within Functional Limits for tasks assessed                                       General  Comments  Mother at bedside    Exercises     Shoulder [place] expects to be discharged to:: Private residence Living Arrangements: Children Available Help at Discharge: Family;Available 24 hours/day Type of Home: House Home Access: Stairs to enter [employer] of Steps: 2 Entrance Stairs-Rails: None Home Layout: Two level;Bed/bath upstairs Alternate Level Stairs-Number of Steps: flight Alternate Level Stairs-Rails: Right;Left Bathroom Shower/Tub: Teacher, early years/pre: Standard     Home Equipment: Hand held shower head;Adaptive equipment;Rolling Walker (2 wheels) Adaptive Equipment: Reacher Additional Comments: Plan to DC to mother's home, has lift chair there and bedroom on first floor      Prior Functioning/Environment Prior Level of Function : Independent/Modified Independent;Driving                        OT Problem List: Pain      OT Treatment/Interventions:      OT Goals(Current goals can be found in the care plan section) Acute Rehab OT Goals Patient Stated Goal: pain control OT Goal Formulation: All assessment and education complete, DC therapy  OT Frequency:      Co-evaluation              AM-PAC OT "6 Clicks" Daily Activity     Outcome Measure Help from another person eating meals?: None Help from another person taking care of personal grooming?: None Help from another person toileting, which includes using toliet, bedpan, or urinal?: None Help from another person bathing (including washing, rinsing, drying)?: None Help from another person to put on and taking off regular upper body clothing?: None Help from another person to put on and taking off regular lower body clothing?: None 6 Click Score: 24   End of Session Nurse Communication: Mobility status;Other (comment) (pain levels)  Activity Tolerance: Patient tolerated treatment well;Patient limited by pain Patient left: in  bed;with call bell/phone within reach;with family/visitor present  OT Visit Diagnosis: Pain Pain - part of body:  (neck)                Time: [id number] OT Time Calculation (min): 19 min Charges:  OT General Charges $OT Visit: 1 Visit OT Evaluation $OT Eval Low Complexity: 1 Low  Malachy Chamber, OTR/L Acute Rehab Services Office: [phone]   Layla Maw 03/21/2022, 9:51 AM

## 2022-03-21 NOTE — Progress Notes (Signed)
Patient alert and oriented, mae's well, voiding adequate amount of urine, swallowing without difficulty, no c/o pain at time of discharge. Patient discharged home with family. Script and discharged instructions given to patient. Patient and family stated understanding of instructions given. Patient has an appointment with Dr. Ostergard   

## 2022-05-20 ENCOUNTER — Other Ambulatory Visit: Payer: Self-pay | Admitting: Neurological Surgery

## 2022-05-27 ENCOUNTER — Encounter: Payer: Self-pay | Admitting: *Deleted

## 2022-05-27 NOTE — Pre-Procedure Instructions (Signed)
Surgical Instructions    Your procedure is scheduled on Monday, April 22nd.  Report to [facility] Main Entrance "A" at 09:25 A.M., then check in with the Admitting office.  Call this number if you have problems the morning of surgery:  [phone]  If you have any questions prior to your surgery date call [phone]: Open Monday-Friday 8am-4pm If you experience any cold or flu symptoms such as cough, fever, chills, shortness of breath, etc. between now and your scheduled surgery, please notify us at the above number.     Remember:  Do not eat after midnight the night before your surgery  You may drink clear liquids until 08:25 AM the morning of your surgery.   Clear liquids allowed are: Water, Non-Citrus Juices (without pulp), Carbonated Beverages, Clear Tea, Black Coffee Only (NO MILK, CREAM OR POWDERED CREAMER of any kind), and Gatorade.    Take these medicines the morning of surgery with A SIP OF WATER  esomeprazole (NEXIUM)   If needed: EPINEPHrine    As of today, STOP taking any Aspirin (unless otherwise instructed by your surgeon) Aleve, Naproxen, Ibuprofen, Motrin, Advil, Goody's, BC's, all herbal medications, fish oil, and all vitamins.                     Do NOT Smoke (Tobacco/Vaping) for 24 hours prior to your procedure.  If you use a CPAP at night, you may bring your mask/headgear for your overnight stay.   Contacts, glasses, piercing's, hearing aid's, dentures or partials may not be worn into surgery, please bring cases for these belongings.    For patients admitted to the hospital, discharge time will be determined by your treatment team.   Patients discharged the day of surgery will not be allowed to drive home, and someone needs to stay with them for 24 hours.  SURGICAL WAITING ROOM VISITATION Patients having surgery or a procedure may have no more than 2 support people in the waiting area - these visitors may rotate.   Children under the age of 60 must have  an adult with them who is not the patient. If the patient needs to stay at the hospital during part of their recovery, the visitor guidelines for inpatient rooms apply. Pre-op nurse will coordinate an appropriate time for 1 support person to accompany patient in pre-op.  This support person may not rotate.   Please refer to the [facility] website for the visitor guidelines for Inpatients (after your surgery is over and you are in a regular room).       Please read over the following fact sheets that you were given.    If you received a COVID test during your pre-op visit  it is requested that you wear a mask when out in public, stay away from anyone that may not be feeling well and notify your surgeon if you develop symptoms. If you have been in contact with anyone that has tested positive in the last 10 days please notify you surgeon.

## 2022-05-28 ENCOUNTER — Other Ambulatory Visit: Payer: Self-pay

## 2022-05-28 ENCOUNTER — Encounter (HOSPITAL_COMMUNITY)
Admission: RE | Admit: 2022-05-28 | Discharge: 2022-05-28 | Disposition: A | Discharge status: Home / Self Care | Payer: Medicaid Other | Source: Ambulatory Visit | Attending: Neurological Surgery | Admitting: Neurological Surgery

## 2022-05-28 ENCOUNTER — Encounter (HOSPITAL_COMMUNITY): Payer: Self-pay

## 2022-05-28 VITALS — BP 144/100 | HR 67 | Temp 98.4°F | Resp 18 | Ht 75.0 in | Wt 250.0 lb

## 2022-05-28 DIAGNOSIS — M4846XG Fatigue fracture of vertebra, lumbar region, subsequent encounter for fracture with delayed healing: Secondary | ICD-10-CM | POA: Diagnosis not present

## 2022-05-28 DIAGNOSIS — G4733 Obstructive sleep apnea (adult) (pediatric): Secondary | ICD-10-CM | POA: Diagnosis not present

## 2022-05-28 DIAGNOSIS — Z01812 Encounter for preprocedural laboratory examination: Secondary | ICD-10-CM | POA: Diagnosis present

## 2022-05-28 DIAGNOSIS — K219 Gastro-esophageal reflux disease without esophagitis: Secondary | ICD-10-CM | POA: Diagnosis not present

## 2022-05-28 DIAGNOSIS — I10 Essential (primary) hypertension: Secondary | ICD-10-CM | POA: Diagnosis not present

## 2022-05-28 DIAGNOSIS — M4316 Spondylolisthesis, lumbar region: Secondary | ICD-10-CM | POA: Insufficient documentation

## 2022-05-28 DIAGNOSIS — Z01818 Encounter for other preprocedural examination: Secondary | ICD-10-CM

## 2022-05-28 HISTORY — DX: Sleep apnea, unspecified: G47.30

## 2022-05-28 HISTORY — DX: Headache, unspecified: R51.9

## 2022-05-28 LAB — BASIC METABOLIC PANEL
Anion gap: 7 (ref 5–15)
BUN: 11 mg/dL (ref 6–20)
CO2: 27 mmol/L (ref 22–32)
Calcium: 9.3 mg/dL (ref 8.9–10.3)
Chloride: 106 mmol/L (ref 98–111)
Creatinine, Ser: 1.05 mg/dL (ref 0.61–1.24)
GFR, Estimated: >60 mL/min (ref >60)
Glucose, Bld: 97 mg/dL (ref 70–99)
Potassium: 4.5 mmol/L (ref 3.5–5.1)
Sodium: 140 mmol/L (ref 135–145)

## 2022-05-28 LAB — CBC
HCT: 42 % (ref 39.0–52.0)
Hemoglobin: 13.9 g/dL (ref 13.0–17.0)
MCH: 28.9 pg (ref 26.0–34.0)
MCHC: 33.1 g/dL (ref 30.0–36.0)
MCV: 87.3 fL (ref 80.0–100.0)
Platelets: 280 10*3/uL (ref 150–400)
RBC: 4.81 MIL/uL (ref 4.22–5.81)
RDW: 14.4 % (ref 11.5–15.5)
WBC: 5.3 10*3/uL (ref 4.0–10.5)
nRBC: 0 % (ref 0.0–0.2)

## 2022-05-28 LAB — TYPE AND SCREEN
ABO/RH(D): O POS
Antibody Screen: NEGATIVE

## 2022-05-28 LAB — SURGICAL PCR SCREEN
MRSA, PCR: NEGATIVE
Staphylococcus aureus: POSITIVE — AB

## 2022-05-28 NOTE — Progress Notes (Signed)
PCP - Laurence Aly, PA Cardiologist - denies  PPM/ICD - denies   Chest x-ray - 09/18/17 EKG - 01/12/22- records requested Stress Test - denies ECHO - denies Cardiac Cath - 09/24/17  Sleep Study - 3-4 years ago per pt, OSA+ CPAP - denies, unable to tolerate per pt  DM- denies  ASA/Blood Thinner Instructions: n/a   ERAS Protcol - yes, no drink   COVID TEST- n/a   Anesthesia review: yes, records requested  Patient denies shortness of breath, fever, cough and chest pain at PAT appointment   All instructions explained to the patient, with a verbal understanding of the material. Patient agrees to go over the instructions while at home for a better understanding.  The opportunity to ask questions was provided.

## 2022-05-29 NOTE — Anesthesia Preprocedure Evaluation (Addendum)
Anesthesia Evaluation  Patient identified by MRN, date of birth, ID band Patient awake    Reviewed: Allergy & Precautions, NPO status , Patient's Chart, lab work & pertinent test results  Airway Mallampati: II  TM Distance: >3 FB Neck ROM: Full    Dental no notable dental hx. (+) Dental Advisory Given   Pulmonary sleep apnea    Pulmonary exam normal        Cardiovascular negative cardio ROS Normal cardiovascular exam  Cardiac cath 09/24/17 ([facility]): Conclusions  Normal LV function  Normal coronary arteries.  Recommendations Medical therapy for CAD Risk factor reduction Recommend work up for non-cardiac causes of symptoms.     Neuro/Psych negative neurological ROS     GI/Hepatic Neg liver ROS,GERD  Medicated,,  Endo/Other  negative endocrine ROS    Renal/GU negative Renal ROS     Musculoskeletal  (+) Arthritis ,    Abdominal   Peds  Hematology negative hematology ROS (+)   Anesthesia Other Findings   Reproductive/Obstetrics                             Anesthesia Physical Anesthesia Plan  ASA: 2  Anesthesia Plan: General   Post-op Pain Management: Tylenol PO (pre-op)* and Toradol IV (intra-op)*   Induction: Intravenous  PONV Risk Score and Plan: 3 and Ondansetron, Dexamethasone, Treatment may vary due to age or medical condition and Midazolam  Airway Management Planned: Oral ETT  Additional Equipment: ClearSight  Intra-op Plan:   Post-operative Plan: Extubation in OR  Informed Consent: I have reviewed the patients History and Physical, chart, labs and discussed the procedure including the risks, benefits and alternatives for the proposed anesthesia with the patient or authorized representative who has indicated his/her understanding and acceptance.     Dental advisory given  Plan Discussed with: Anesthesiologist and CRNA  Anesthesia Plan Comments: (PAT note written  05/29/2022 by Shonna Chock, PA-C.  )       Anesthesia Quick Evaluation
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
## 2022-06-02 ENCOUNTER — Encounter (HOSPITAL_COMMUNITY)
Admission: RE | Disposition: A | Discharge status: Home / Self Care | Payer: Self-pay | Source: Home / Self Care | Attending: Neurological Surgery

## 2022-06-02 ENCOUNTER — Inpatient Hospital Stay (HOSPITAL_COMMUNITY): Payer: Medicaid Other | Admitting: Vascular Surgery

## 2022-06-02 ENCOUNTER — Inpatient Hospital Stay (HOSPITAL_COMMUNITY)
Admission: RE | Admit: 2022-06-02 | Discharge: 2022-06-04 | DRG: 460 | Disposition: A | Discharge status: Home / Self Care | Payer: Medicaid Other | Attending: Neurological Surgery | Admitting: Neurological Surgery

## 2022-06-02 ENCOUNTER — Inpatient Hospital Stay (HOSPITAL_COMMUNITY): Payer: Medicaid Other

## 2022-06-02 ENCOUNTER — Other Ambulatory Visit: Payer: Self-pay

## 2022-06-02 ENCOUNTER — Encounter (HOSPITAL_COMMUNITY): Payer: Self-pay | Admitting: Neurological Surgery

## 2022-06-02 ENCOUNTER — Inpatient Hospital Stay (HOSPITAL_COMMUNITY): Payer: Medicaid Other | Admitting: Anesthesiology

## 2022-06-02 DIAGNOSIS — M1990 Unspecified osteoarthritis, unspecified site: Secondary | ICD-10-CM | POA: Diagnosis not present

## 2022-06-02 DIAGNOSIS — K219 Gastro-esophageal reflux disease without esophagitis: Secondary | ICD-10-CM | POA: Diagnosis present

## 2022-06-02 DIAGNOSIS — M4316 Spondylolisthesis, lumbar region: Secondary | ICD-10-CM

## 2022-06-02 DIAGNOSIS — M4846XA Fatigue fracture of vertebra, lumbar region, initial encounter for fracture: Secondary | ICD-10-CM | POA: Diagnosis present

## 2022-06-02 DIAGNOSIS — M5416 Radiculopathy, lumbar region: Secondary | ICD-10-CM | POA: Diagnosis present

## 2022-06-02 DIAGNOSIS — Z981 Arthrodesis status: Secondary | ICD-10-CM

## 2022-06-02 DIAGNOSIS — Z8249 Family history of ischemic heart disease and other diseases of the circulatory system: Secondary | ICD-10-CM

## 2022-06-02 DIAGNOSIS — G4730 Sleep apnea, unspecified: Secondary | ICD-10-CM

## 2022-06-02 DIAGNOSIS — I10 Essential (primary) hypertension: Secondary | ICD-10-CM | POA: Diagnosis present

## 2022-06-02 SURGERY — POSTERIOR LUMBAR FUSION 2 LEVEL
Anesthesia: General | Site: Spine Lumbar

## 2022-06-02 MED ORDER — ACETAMINOPHEN 500 MG PO TABS
1000.0000 mg | ORAL_TABLET | Freq: Once | ORAL | Status: DC
  Filled 2022-06-02: qty 2

## 2022-06-02 MED ORDER — POLYETHYLENE GLYCOL 3350 17 G PO PACK
17.0000 g | PACK | Freq: Every day | ORAL | Status: DC | PRN
  Administered 2022-06-03: 17 g via ORAL
  Filled 2022-06-02 (×2): qty 1

## 2022-06-02 MED ORDER — PHENOL 1.4 % MT LIQD: 1.0000 | OROMUCOSAL | Status: DC | PRN

## 2022-06-02 MED ORDER — LIDOCAINE 2% (20 MG/ML) 5 ML SYRINGE
INTRAMUSCULAR | Status: DC | PRN
  Administered 2022-06-02: 80 mg via INTRAVENOUS

## 2022-06-02 MED ORDER — CEFAZOLIN SODIUM-DEXTROSE 2-4 GM/100ML-% IV SOLN
INTRAVENOUS | Status: AC
  Filled 2022-06-02: qty 100

## 2022-06-02 MED ORDER — CHLORHEXIDINE GLUCONATE 0.12 % MT SOLN
OROMUCOSAL | Status: AC
  Administered 2022-06-02: 15 mL via OROMUCOSAL
  Filled 2022-06-02: qty 15

## 2022-06-02 MED ORDER — FENTANYL CITRATE (PF) 100 MCG/2ML IJ SOLN
INTRAMUSCULAR | Status: AC
  Filled 2022-06-02: qty 2

## 2022-06-02 MED ORDER — MIDAZOLAM HCL 2 MG/2ML IJ SOLN
INTRAMUSCULAR | Status: AC
  Filled 2022-06-02: qty 2

## 2022-06-02 MED ORDER — ROCURONIUM BROMIDE 10 MG/ML (PF) SYRINGE
PREFILLED_SYRINGE | INTRAVENOUS | Status: DC | PRN
  Administered 2022-06-02: 70 mg via INTRAVENOUS
  Administered 2022-06-02 (×2): 20 mg via INTRAVENOUS
  Administered 2022-06-02: 30 mg via INTRAVENOUS

## 2022-06-02 MED ORDER — HYDROMORPHONE HCL 1 MG/ML IJ SOLN
INTRAMUSCULAR | Status: DC | PRN
  Administered 2022-06-02 (×2): .5 mg via INTRAVENOUS

## 2022-06-02 MED ORDER — HYDROMORPHONE HCL 1 MG/ML IJ SOLN
INTRAMUSCULAR | Status: AC
  Filled 2022-06-02: qty 0.5

## 2022-06-02 MED ORDER — SODIUM CHLORIDE 0.9% FLUSH: 3.0000 mL | INTRAVENOUS | Status: DC | PRN

## 2022-06-02 MED ORDER — ALBUMIN HUMAN 5 % IV SOLN: INTRAVENOUS | Status: DC | PRN

## 2022-06-02 MED ORDER — ROCURONIUM BROMIDE 10 MG/ML (PF) SYRINGE
PREFILLED_SYRINGE | INTRAVENOUS | Status: AC
  Filled 2022-06-02: qty 10

## 2022-06-02 MED ORDER — SODIUM CHLORIDE 0.9 % IV SOLN
250.0000 mL | INTRAVENOUS | Status: DC
  Administered 2022-06-02: 250 mL via INTRAVENOUS

## 2022-06-02 MED ORDER — ONDANSETRON HCL 4 MG/2ML IJ SOLN
INTRAMUSCULAR | Status: AC
  Filled 2022-06-02: qty 2

## 2022-06-02 MED ORDER — PROPOFOL 10 MG/ML IV BOLUS
INTRAVENOUS | Status: DC | PRN
  Administered 2022-06-02: 40 mg via INTRAVENOUS
  Administered 2022-06-02: 200 mg via INTRAVENOUS

## 2022-06-02 MED ORDER — ONDANSETRON HCL 4 MG PO TABS
4.0000 mg | ORAL_TABLET | Freq: Four times a day (QID) | ORAL | Status: DC | PRN
  Administered 2022-06-03: 4 mg via ORAL
  Filled 2022-06-02: qty 1

## 2022-06-02 MED ORDER — 0.9 % SODIUM CHLORIDE (POUR BTL) OPTIME
TOPICAL | Status: DC | PRN
  Administered 2022-06-02: 1000 mL

## 2022-06-02 MED ORDER — CHLORHEXIDINE GLUCONATE CLOTH 2 % EX PADS: 6.0000 | MEDICATED_PAD | Freq: Once | CUTANEOUS | Status: DC

## 2022-06-02 MED ORDER — FENTANYL CITRATE (PF) 250 MCG/5ML IJ SOLN
INTRAMUSCULAR | Status: DC | PRN
  Administered 2022-06-02 (×3): 50 ug via INTRAVENOUS
  Administered 2022-06-02: 100 ug via INTRAVENOUS

## 2022-06-02 MED ORDER — CHLORHEXIDINE GLUCONATE 0.12 % MT SOLN: 15.0000 mL | Freq: Once | OROMUCOSAL | Status: AC

## 2022-06-02 MED ORDER — CYCLOBENZAPRINE HCL 10 MG PO TABS
10.0000 mg | ORAL_TABLET | Freq: Three times a day (TID) | ORAL | Status: DC | PRN
  Administered 2022-06-02: 10 mg via ORAL
  Filled 2022-06-02 (×3): qty 1

## 2022-06-02 MED ORDER — AMISULPRIDE (ANTIEMETIC) 5 MG/2ML IV SOLN: 10.0000 mg | Freq: Once | INTRAVENOUS | Status: DC | PRN

## 2022-06-02 MED ORDER — ACETAMINOPHEN 325 MG PO TABS
650.0000 mg | ORAL_TABLET | ORAL | Status: DC | PRN
  Administered 2022-06-03: 325 mg via ORAL
  Filled 2022-06-02: qty 2

## 2022-06-02 MED ORDER — DOCUSATE SODIUM 100 MG PO CAPS
100.0000 mg | ORAL_CAPSULE | Freq: Two times a day (BID) | ORAL | Status: DC
  Administered 2022-06-02 – 2022-06-04 (×3): 100 mg via ORAL
  Filled 2022-06-02 (×4): qty 1

## 2022-06-02 MED ORDER — LIDOCAINE 2% (20 MG/ML) 5 ML SYRINGE
INTRAMUSCULAR | Status: AC
  Filled 2022-06-02: qty 5

## 2022-06-02 MED ORDER — HYDROCODONE-ACETAMINOPHEN 10-325 MG PO TABS
1.0000 | ORAL_TABLET | ORAL | Status: DC | PRN
  Administered 2022-06-02 – 2022-06-04 (×8): 1 via ORAL
  Filled 2022-06-02 (×8): qty 1

## 2022-06-02 MED ORDER — OXYCODONE HCL 5 MG PO TABS
10.0000 mg | ORAL_TABLET | ORAL | Status: DC | PRN
Start: 2022-06-02 — End: 2022-06-02

## 2022-06-02 MED ORDER — LACTATED RINGERS IV SOLN: INTRAVENOUS | Status: DC

## 2022-06-02 MED ORDER — ONDANSETRON HCL 4 MG/2ML IJ SOLN
INTRAMUSCULAR | Status: DC | PRN
  Administered 2022-06-02: 4 mg via INTRAVENOUS

## 2022-06-02 MED ORDER — ONDANSETRON HCL 4 MG/2ML IJ SOLN: 4.0000 mg | Freq: Four times a day (QID) | INTRAMUSCULAR | Status: DC | PRN

## 2022-06-02 MED ORDER — LABETALOL HCL 5 MG/ML IV SOLN
INTRAVENOUS | Status: AC
  Filled 2022-06-02: qty 4

## 2022-06-02 MED ORDER — HYDROMORPHONE HCL 1 MG/ML IJ SOLN
0.2500 mg | INTRAMUSCULAR | Status: DC | PRN
  Administered 2022-06-02: 0.5 mg via INTRAVENOUS

## 2022-06-02 MED ORDER — MENTHOL 3 MG MT LOZG: 1.0000 | LOZENGE | OROMUCOSAL | Status: DC | PRN

## 2022-06-02 MED ORDER — LABETALOL HCL 5 MG/ML IV SOLN
INTRAVENOUS | Status: DC | PRN
  Administered 2022-06-02: 5 mg via INTRAVENOUS

## 2022-06-02 MED ORDER — CEFAZOLIN SODIUM-DEXTROSE 2-4 GM/100ML-% IV SOLN
2.0000 g | INTRAVENOUS | Status: AC
  Administered 2022-06-02: 2 g via INTRAVENOUS

## 2022-06-02 MED ORDER — ACETAMINOPHEN 500 MG PO TABS
500.0000 mg | ORAL_TABLET | Freq: Once | ORAL | Status: AC
  Administered 2022-06-02: 500 mg via ORAL

## 2022-06-02 MED ORDER — HYDROMORPHONE HCL 1 MG/ML IJ SOLN
INTRAMUSCULAR | Status: AC
  Filled 2022-06-02: qty 1

## 2022-06-02 MED ORDER — PROMETHAZINE HCL 25 MG/ML IJ SOLN: 6.2500 mg | INTRAMUSCULAR | Status: DC | PRN

## 2022-06-02 MED ORDER — DEXAMETHASONE SODIUM PHOSPHATE 10 MG/ML IJ SOLN
INTRAMUSCULAR | Status: DC | PRN
  Administered 2022-06-02: 10 mg via INTRAVENOUS

## 2022-06-02 MED ORDER — SUGAMMADEX SODIUM 200 MG/2ML IV SOLN
INTRAVENOUS | Status: DC | PRN
  Administered 2022-06-02: 400 mg via INTRAVENOUS

## 2022-06-02 MED ORDER — SODIUM CHLORIDE 0.9% FLUSH: 3.0000 mL | Freq: Two times a day (BID) | INTRAVENOUS | Status: DC

## 2022-06-02 MED ORDER — CEFAZOLIN SODIUM-DEXTROSE 2-4 GM/100ML-% IV SOLN
2.0000 g | Freq: Three times a day (TID) | INTRAVENOUS | Status: AC
  Administered 2022-06-02 – 2022-06-03 (×2): 2 g via INTRAVENOUS
  Filled 2022-06-02 (×2): qty 100

## 2022-06-02 MED ORDER — LIDOCAINE-EPINEPHRINE 1 %-1:100000 IJ SOLN
INTRAMUSCULAR | Status: AC
  Filled 2022-06-02: qty 1

## 2022-06-02 MED ORDER — OXYCODONE HCL 5 MG PO TABS
5.0000 mg | ORAL_TABLET | ORAL | Status: DC | PRN
Start: 2022-06-02 — End: 2022-06-02

## 2022-06-02 MED ORDER — MIDAZOLAM HCL 2 MG/2ML IJ SOLN
INTRAMUSCULAR | Status: DC | PRN
  Administered 2022-06-02: 2 mg via INTRAVENOUS

## 2022-06-02 MED ORDER — DEXAMETHASONE SODIUM PHOSPHATE 10 MG/ML IJ SOLN
INTRAMUSCULAR | Status: AC
  Filled 2022-06-02: qty 1

## 2022-06-02 MED ORDER — FENTANYL CITRATE (PF) 100 MCG/2ML IJ SOLN
25.0000 ug | INTRAMUSCULAR | Status: DC | PRN
  Administered 2022-06-02: 50 ug via INTRAVENOUS
  Administered 2022-06-02 (×2): 25 ug via INTRAVENOUS
  Administered 2022-06-02: 50 ug via INTRAVENOUS

## 2022-06-02 MED ORDER — ORAL CARE MOUTH RINSE: 15.0000 mL | Freq: Once | OROMUCOSAL | Status: AC

## 2022-06-02 MED ORDER — ACETAMINOPHEN 650 MG RE SUPP: 650.0000 mg | RECTAL | Status: DC | PRN

## 2022-06-02 MED ORDER — PHENYLEPHRINE HCL-NACL 20-0.9 MG/250ML-% IV SOLN
INTRAVENOUS | Status: DC | PRN
  Administered 2022-06-02: 25 ug/min via INTRAVENOUS

## 2022-06-02 MED ORDER — LIDOCAINE-EPINEPHRINE 1 %-1:100000 IJ SOLN
INTRAMUSCULAR | Status: DC | PRN
  Administered 2022-06-02: 10 mL

## 2022-06-02 MED ORDER — HYDROCODONE-ACETAMINOPHEN 5-325 MG PO TABS: 1.0000 | ORAL_TABLET | ORAL | Status: DC | PRN

## 2022-06-02 MED ORDER — EPINEPHRINE 0.3 MG/0.3ML IJ SOAJ: 0.3000 mg | INTRAMUSCULAR | Status: DC | PRN

## 2022-06-02 MED ORDER — HYDROMORPHONE HCL 1 MG/ML IJ SOLN
1.0000 mg | INTRAMUSCULAR | Status: DC | PRN
  Administered 2022-06-02 – 2022-06-03 (×4): 1 mg via INTRAVENOUS
  Filled 2022-06-02 (×4): qty 1

## 2022-06-02 MED ORDER — THROMBIN 5000 UNITS EX SOLR
OROMUCOSAL | Status: DC | PRN
  Administered 2022-06-02: 5 mL via TOPICAL

## 2022-06-02 MED ORDER — THROMBIN 5000 UNITS EX SOLR
CUTANEOUS | Status: AC
  Filled 2022-06-02: qty 5000

## 2022-06-02 MED ORDER — FENTANYL CITRATE (PF) 250 MCG/5ML IJ SOLN
INTRAMUSCULAR | Status: AC
  Filled 2022-06-02: qty 5

## 2022-06-02 SURGICAL SUPPLY — 79 items
ADH SKN CLS APL DERMABOND .7 (GAUZE/BANDAGES/DRESSINGS) ×2
APL SKNCLS STERI-STRIP NONHPOA (GAUZE/BANDAGES/DRESSINGS)
BAG COUNTER SPONGE SURGICOUNT (BAG) ×3 IMPLANT
BAG SPNG CNTER NS LX DISP (BAG) ×2
BASKET BONE COLLECTION (BASKET) ×3 IMPLANT
BENZOIN TINCTURE PRP APPL 2/3 (GAUZE/BANDAGES/DRESSINGS) IMPLANT
BLADE BONE MILL MEDIUM (MISCELLANEOUS) IMPLANT
BLADE CLIPPER SURG (BLADE) IMPLANT
BLADE SURG 11 STRL SS (BLADE) ×3 IMPLANT
BUR 14 MATCH 3 (BUR) IMPLANT
BUR MATCHSTICK NEURO 3.0 LAGG (BURR) ×3 IMPLANT
BUR MR8 14CM BALL SYMTRI 5 (BUR) IMPLANT
BUR PRECISION FLUTE 5.0 (BURR) ×3 IMPLANT
BURR 14 MATCH 3 (BUR) ×2
BURR MR8 14CM BALL SYMTRI 5 (BUR) ×2
CANISTER SUCT 3000ML PPV (MISCELLANEOUS) ×3 IMPLANT
CNTNR URN SCR LID CUP LEK RST (MISCELLANEOUS) ×3 IMPLANT
CONT SPEC 4OZ STRL OR WHT (MISCELLANEOUS) ×2
COVER BACK TABLE 60X90IN (DRAPES) ×3 IMPLANT
COVERAGE SUPPORT O-ARM STEALTH (MISCELLANEOUS) ×2 IMPLANT
DERMABOND ADVANCED .7 DNX12 (GAUZE/BANDAGES/DRESSINGS) ×3 IMPLANT
DRAPE C-ARM 42X72 X-RAY (DRAPES) IMPLANT
DRAPE C-ARMOR (DRAPES) IMPLANT
DRAPE LAPAROTOMY 100X72X124 (DRAPES) ×3 IMPLANT
DRAPE SHEET LG 3/4 BI-LAMINATE (DRAPES) ×12 IMPLANT
DRAPE SURG 17X23 STRL (DRAPES) ×3 IMPLANT
DURAPREP 26ML APPLICATOR (WOUND CARE) ×3 IMPLANT
ELECT REM PT RETURN 9FT ADLT (ELECTROSURGICAL) ×2
ELECTRODE REM PT RTRN 9FT ADLT (ELECTROSURGICAL) ×3 IMPLANT
FEE COVERAGE SUPPORT O-ARM (MISCELLANEOUS) ×3 IMPLANT
GAUZE 4X4 16PLY ~~LOC~~+RFID DBL (SPONGE) IMPLANT
GAUZE SPONGE 4X4 12PLY STRL (GAUZE/BANDAGES/DRESSINGS) IMPLANT
GLOVE BIOGEL PI IND STRL 7.5 (GLOVE) ×6 IMPLANT
GLOVE ECLIPSE 7.5 STRL STRAW (GLOVE) ×6 IMPLANT
GLOVE EXAM NITRILE LRG STRL (GLOVE) IMPLANT
GLOVE EXAM NITRILE XL STR (GLOVE) IMPLANT
GLOVE EXAM NITRILE XS STR PU (GLOVE) IMPLANT
GOWN STRL REUS W/ TWL LRG LVL3 (GOWN DISPOSABLE) ×12 IMPLANT
GOWN STRL REUS W/ TWL XL LVL3 (GOWN DISPOSABLE) IMPLANT
GOWN STRL REUS W/TWL 2XL LVL3 (GOWN DISPOSABLE) IMPLANT
GOWN STRL REUS W/TWL LRG LVL3 (GOWN DISPOSABLE) ×8
GOWN STRL REUS W/TWL XL LVL3 (GOWN DISPOSABLE)
GRAFT BN 5X1XSPNE CVD POST DBM (Bone Implant) IMPLANT
GRAFT BONE MAGNIFUSE 1X5CM (Bone Implant) ×2 IMPLANT
HEMOSTAT POWDER KIT SURGIFOAM (HEMOSTASIS) ×3 IMPLANT
KIT BASIN OR (CUSTOM PROCEDURE TRAY) ×3 IMPLANT
KIT INFUSE SMALL (Orthopedic Implant) IMPLANT
KIT POSITION SURG JACKSON T1 (MISCELLANEOUS) ×3 IMPLANT
KIT TURNOVER KIT B (KITS) ×3 IMPLANT
MARKER SPHERE PSV REFLC NDI (MISCELLANEOUS) ×15 IMPLANT
MILL BONE PREP (MISCELLANEOUS) ×3 IMPLANT
NDL HYPO 18GX1.5 BLUNT FILL (NEEDLE) IMPLANT
NDL SPNL 18GX3.5 QUINCKE PK (NEEDLE) IMPLANT
NEEDLE HYPO 18GX1.5 BLUNT FILL (NEEDLE) IMPLANT
NEEDLE HYPO 22GX1.5 SAFETY (NEEDLE) ×3 IMPLANT
NEEDLE SPNL 18GX3.5 QUINCKE PK (NEEDLE) IMPLANT
NS IRRIG 1000ML POUR BTL (IV SOLUTION) ×3 IMPLANT
PACK LAMINECTOMY NEURO (CUSTOM PROCEDURE TRAY) ×3 IMPLANT
PAD ARMBOARD 7.5X6 YLW CONV (MISCELLANEOUS) ×9 IMPLANT
ROD CVD SOLERA 5.5X90 (Rod) IMPLANT
ROD SOLERA 80MM (Rod) ×2 IMPLANT
ROD SOLERA 80X5.5XNS TI (Rod) IMPLANT
SCREW MAS/SET HEAD FOR 5.5 ROD (Screw) IMPLANT
SCREW SHANK NL TITAN 6.5X45 (Screw) IMPLANT
SOL ELECTROSURG ANTI STICK (MISCELLANEOUS) ×2
SOLUTION ELECTROSURG ANTI STCK (MISCELLANEOUS) ×6 IMPLANT
SPIKE FLUID TRANSFER (MISCELLANEOUS) ×3 IMPLANT
SPONGE SURGIFOAM ABS GEL 100 (HEMOSTASIS) IMPLANT
SPONGE T-LAP 4X18 ~~LOC~~+RFID (SPONGE) IMPLANT
STRIP CLOSURE SKIN 1/2X4 (GAUZE/BANDAGES/DRESSINGS) IMPLANT
SUT MNCRL AB 3-0 PS2 18 (SUTURE) ×3 IMPLANT
SUT VIC AB 0 CT1 18XCR BRD8 (SUTURE) ×3 IMPLANT
SUT VIC AB 0 CT1 8-18 (SUTURE) ×2
SUT VIC AB 2-0 CP2 18 (SUTURE) ×3 IMPLANT
SYR 3ML LL SCALE MARK (SYRINGE) IMPLANT
TOWEL GREEN STERILE (TOWEL DISPOSABLE) ×3 IMPLANT
TOWEL GREEN STERILE FF (TOWEL DISPOSABLE) ×3 IMPLANT
TRAY FOLEY MTR SLVR 16FR STAT (SET/KITS/TRAYS/PACK) ×3 IMPLANT
WATER STERILE IRR 1000ML POUR (IV SOLUTION) ×3 IMPLANT

## 2022-06-02 NOTE — Op Note (Signed)
PATIENT: Evan Kim  DAY OF SURGERY: 06/02/22   PRE-OPERATIVE DIAGNOSIS:  Lumbar pedicle stress fractures, lumbar spondylolisthesis   POST-OPERATIVE DIAGNOSIS:  Same   PROCEDURE:  L3-4, L4-5 open posterolateral instrumented, extension of prior fusion now spanning L3-S1, use of frameless stereotaxy and intraoperative CT   SURGEON:  Surgeon(s) and Role:    Jadene Pierini, MD    Emilee Hero PA   ANESTHESIA: ETGA   BRIEF HISTORY: This is a 57 year old man who presented with severe back pain and some RLE pain. The patient was found to have MRI findings consistent with stress fractures at L3 and L4 bilaterally with a mobile spondylolisthesis at L4-5 above his prior fusion at L5-S1. His symptoms were refractory to non-surgical management, I therefore recommended extension of his prior fusion up to L3, now spanning L3-S1.   OPERATIVE DETAIL: The patient was taken to the operating room and anesthesia was induced by the anesthesia team. They were placed on the OR table in the prone position with padding of all pressure points. A formal time out was performed with two patient identifiers and confirmed the operative site. The operative site was marked, hair was clipped with surgical clippers, the area was then prepped and draped in a sterile fashion. Fluoro was used to localize the operative level and a midline incision was placed to expose from L3 to S1. Subperiosteal dissection was performed bilaterally and the prior hardware was used to localize. The caps were removed from the old hardware and saved then rods removed, to minimize artifact on the CT.  A spinous process clamp was applied and secured caudally, followed by attachment of a reference array. The field was draped and the O-arm was brought into the field. An intra-op CT was performed, sent to the Stealth navigation station, registered to the patient's anatomy, and confirmed with landmarks with acceptable fit. Stereotactic  spinal navigation was utilized throughout the procedure for planning and placement of pedicle screw trajectories.  Instrumentation was then performed. Frameless stereotaxy was used to guide placement of bilateral pedicle screws ([employer]) at L3 and L4 bilaterally. These were placed with navigated instruments by localizing the pedicle, drilling a pilot hole, cannulating the pedicle with an awl-tap, palpating for pedicle wall breaches, and then placing the screw.   Rods were introduced bilaterally into the new screws at L3 and L4 as well as the existing screws at L5 and S1. We purposely lined up the new screws in the medial-lateral direction but had to back out the L5 screws to line up the rods. I was able to get a new rod introduced bilaterally. On the left at L5, the screw head was still too deep to get a cap on and, since this was an old system, no reducing system was available so I left out the cap given that the 5-1 segment was well fused radiographically. The old and new caps were then placed at other locations and final tightened according to manufacturer torque specifications. The bone was thoroughly decorticated over the fusion surface and cortical bone chips were used as allograft to complete a posterolateral instrumented fusion at L3-4 and L4-5 with addition of rBMP.   Alli Consentino PA was scrubbed and assisted with the entire procedure which included exposure, instrumentation, and closure.  All instrument and sponge counts were correct, the incision was then closed in layers. The patient was then returned to anesthesia for emergence. No apparent complications at the completion of the procedure.   EBL:   DRAINS: none   SPECIMENS: none   Jadene Pierini, MD

## 2022-06-02 NOTE — Anesthesia Procedure Notes (Signed)
Procedure Name: Intubation Date/Time: 06/02/2022 11:33 AM  Performed by: Sharyn Dross, CRNAPre-anesthesia Checklist: Patient identified, Emergency Drugs available, Suction available and Patient being monitored Patient Re-evaluated:Patient Re-evaluated prior to induction Oxygen Delivery Method: Circle system utilized Preoxygenation: Pre-oxygenation with 100% oxygen Induction Type: IV induction Ventilation: Mask ventilation without difficulty Laryngoscope Size: Mac and 4 Grade View: Grade I Tube type: Oral Tube size: 7.5 mm Number of attempts: 1 Airway Equipment and Method: Stylet and Oral airway Placement Confirmation: ETT inserted through vocal cords under direct vision, positive ETCO2 and breath sounds checked- equal and bilateral Secured at: 23 cm Tube secured with: Tape Dental Injury: Teeth and Oropharynx as per pre-operative assessment

## 2022-06-02 NOTE — Transfer of Care (Signed)
Immediate Anesthesia Transfer of Care Note  Patient: Evan Kim  Procedure(s) Performed: Lumbar three-four, Lumbar four-five Open Posterolateral instrumented fusion (Spine Lumbar) Application of O-Arm  Patient Location: PACU  Anesthesia Type:General  Level of Consciousness: awake, drowsy, and patient cooperative  Airway & Oxygen Therapy: Patient connected to face mask oxygen  Post-op Assessment: Report given to RN and Post -op Vital signs reviewed and stable  Post vital signs: Reviewed and stable  Last Vitals:  Vitals Value Taken Time  BP 153/109 06/02/22 1448  Temp    Pulse 74 06/02/22 1450  Resp 17 06/02/22 1450  SpO2 99 % 06/02/22 1450  Vitals shown include unvalidated device data.  Last Pain:  Vitals:   06/02/22 0940  TempSrc:   PainSc: 10-Worst pain ever         Complications: There were no known notable events for this encounter.
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
## 2022-06-03 LAB — GLUCOSE, CAPILLARY: Glucose-Capillary: 123 mg/dL — ABNORMAL HIGH (ref 70–99)

## 2022-06-03 MED ORDER — MAGNESIUM CITRATE PO SOLN
1.0000 | Freq: Once | ORAL | Status: AC
  Administered 2022-06-03: 1 via ORAL
  Filled 2022-06-03: qty 296

## 2022-06-03 MED ORDER — HYDROXYZINE HCL 50 MG/ML IM SOLN
50.0000 mg | Freq: Four times a day (QID) | INTRAMUSCULAR | Status: DC | PRN
  Administered 2022-06-03: 50 mg via INTRAMUSCULAR
  Filled 2022-06-03: qty 1

## 2022-06-03 MED ORDER — ALUM & MAG HYDROXIDE-SIMETH 200-200-20 MG/5ML PO SUSP: 30.0000 mL | Freq: Four times a day (QID) | ORAL | Status: DC | PRN

## 2022-06-03 NOTE — Evaluation (Signed)
Physical Therapy Evaluation Patient Details Name: Evan Kim MRN: [id number] DOB: 20-Mar-1965 Today's Date: 06/03/2022  History of Present Illness  57 yo male admitted 4/22 s/p L3-4 L4-5 posterolateral instrumented extension of prior fusion now spanning L3-S1 using frameless stereotaxy and intraoperative CT PMH arthritis back pain HTN s1 massx3 pressing on sciatic nerve sleep apnea on cpap L5-s1 PSIF  Clinical Impression  Patient is s/p above surgery resulting in functional limitations due to the deficits listed below (see PT Problem List). Ambulating with light support from RW for comfort due to back pain, no buckling noted. Navigates stairs with min guard for safety. Mother/family available 24/7 at d/c as needed. Adequate for d/c from mobility standpoint when medically cleared. Will continue to follow and progress during admission.  Patient will benefit from acute skilled PT to increase their independence and safety with mobility to facilitate discharge.        Recommendations for follow up therapy are one component of a multi-disciplinary discharge planning process, led by the attending physician.  Recommendations may be updated based on patient status, additional functional criteria and insurance authorization.     Assistance Recommended at Discharge Set up Supervision/Assistance  Patient can return home with the following  Assistance with cooking/housework;Assist for transportation;Help with stairs or ramp for entrance    Equipment Recommendations Rolling walker (2 wheels)     Functional Status Assessment Patient has had a recent decline in their functional status and demonstrates the ability to make significant improvements in function in a reasonable and predictable amount of time.     Precautions / Restrictions Precautions Precautions: Back Precaution Comments: Reviewed precautions Restrictions Weight Bearing Restrictions: No      Mobility  Bed Mobility Overal bed  mobility: Modified Independent             General bed mobility comments: Pt familiar with log roll, able to perform without physical assistance, good technique.    Transfers Overall transfer level: Needs assistance Equipment used: Rolling walker (2 wheels) Transfers: Sit to/from Stand Sit to Stand: Supervision           General transfer comment: Supervision for safety, no buckling, RW for support upon rising. Rigid but stable.    Ambulation/Gait Ambulation/Gait assistance: Supervision Gait Distance (Feet): 115 Feet Assistive device: Rolling walker (2 wheels) Gait Pattern/deviations: Step-through pattern, Decreased stride length Gait velocity: decr Gait velocity interpretation: <1.8 ft/sec, indicate of risk for recurrent falls   General Gait Details: Rigid and guarded but without signs of buckling or overt instability. Pt with preferance for RW which reduced back pain by supporting himself lightly.  Stairs Stairs: Yes Stairs assistance: Min guard Stair Management: One rail Right, Alternating pattern, Forwards Number of Stairs: 13 General stair comments: Educated on technique, similar set-up to home environment. Demonstrated step-to and lateral approach. Pt preferred forward alternating steps. No buckling noted, still guarded but stable with rail for support.  Wheelchair Mobility    Modified Rankin (Stroke Patients Only)       Balance Overall balance assessment: Mild deficits observed, not formally tested                                           Pertinent Vitals/Pain Pain Assessment Pain Assessment: Faces Faces Pain Scale: Hurts whole lot Pain Location: back Pain Descriptors / Indicators: Operative site guarding Pain Intervention(s): Monitored during session, Repositioned    Home  Living Family/patient expects to be discharged to:: Private residence Living Arrangements: Other (Comment);Parent (Staying with his mother during  recovery.) Available Help at Discharge: Family;Available 24 hours/day Type of Home: House Home Access: Stairs to enter Entrance Stairs-Rails: Right Entrance Stairs-Number of Steps: 6-8 Alternate Level Stairs-Number of Steps: 6-8 Home Layout: Two level;Bed/bath upstairs Home Equipment: None Additional Comments: Planning to stay with his mother at d/c. Reports split level home, bed and 1/2 bath on bottom floor, main bath upstairs.    Prior Function Prior Level of Function : Independent/Modified Independent;Driving             Mobility Comments: ind ADLs Comments: ind, works for [employer]   Dominant Hand: Right    Extremity/Trunk Assessment   Upper Extremity Assessment Upper Extremity Assessment: Defer to OT evaluation    Lower Extremity Assessment Lower Extremity Assessment: Overall WFL for tasks assessed (Guarding)       Communication   Communication: No difficulties  Cognition Arousal/Alertness: Awake/alert Behavior During Therapy: WFL for tasks assessed/performed Overall Cognitive Status: Within Functional Limits for tasks assessed                                          General Comments General comments (skin integrity, edema, etc.): Reviewed precautions, ice applied    Exercises     Assessment/Plan    PT Assessment Patient needs continued PT services  PT Problem List Decreased range of motion;Decreased activity tolerance;Decreased mobility;Decreased knowledge of use of DME;Decreased knowledge of precautions;Pain       PT Treatment Interventions DME instruction;Gait training;Stair training;Functional mobility training;Therapeutic activities;Therapeutic exercise;Balance training;Neuromuscular re-education;Patient/family education;Modalities    PT Goals (Current goals can be found in the Care Plan section)  Acute Rehab PT Goals Patient Stated Goal: get well, reduce pain PT Goal Formulation: With patient Time For  Goal Achievement: 06/10/22 Potential to Achieve Goals: Good    Frequency Min 4X/week     Co-evaluation               AM-PAC PT "6 Clicks" Mobility  Outcome Measure Help needed turning from your back to your side while in a flat bed without using bedrails?: None Help needed moving from lying on your back to sitting on the side of a flat bed without using bedrails?: None Help needed moving to and from a bed to a chair (including a wheelchair)?: A Little Help needed standing up from a chair using your arms (e.g., wheelchair or bedside chair)?: A Little Help needed to walk in hospital room?: A Little Help needed climbing 3-5 steps with a railing? : A Little 6 Click Score: 20    End of Session Equipment Utilized During Treatment: Gait belt Activity Tolerance: Patient tolerated treatment well Patient left: in bed;with call bell/phone within reach;with family/visitor present Nurse Communication: Mobility status PT Visit Diagnosis: Other abnormalities of gait and mobility (R26.89);Pain;Unsteadiness on feet (R26.81);Difficulty in walking, not elsewhere classified (R26.2) Pain - part of body:  (back)    Time: 0910-0930 PT Time Calculation (min) (ACUTE ONLY): 20 min   Charges:   PT Evaluation $PT Eval Low Complexity: 1 Low          Kathlyn Sacramento, PT, DPT Physical Therapist Acute Rehabilitation Services [facility]   Berton Mount 06/03/2022, 10:32 AM

## 2022-06-03 NOTE — Evaluation (Signed)
Occupational Therapy Evaluation Patient Details Name: Evan Kim MRN: [id number] DOB: 06/20/1965 Today's Date: 06/03/2022   History of Present Illness 57 yo male admitted 4/22 s/p L3-4 L4-5 posterolateral instrumented extension of prior fusion now spanning L3-S1 using frameless stereotaxy and intraoperative CT PMH arthritis back pain HTN s1 massx3 pressing on sciatic nerve sleep apnea on cpap L5-s1 PSIF   Clinical Impression   Patient evaluated by Occupational Therapy with no further acute OT needs identified. All education has been completed and the patient has no further questions. See below for any follow-up Occupational Therapy or equipment needs. OT to sign off. Thank you for referral.        Recommendations for follow up therapy are one component of a multi-disciplinary discharge planning process, led by the attending physician.  Recommendations may be updated based on patient status, additional functional criteria and insurance authorization.   Assistance Recommended at Discharge None  Patient can return home with the following Assist for transportation    Functional Status Assessment  Patient has had a recent decline in their functional status and demonstrates the ability to make significant improvements in function in a reasonable and predictable amount of time.  Equipment Recommendations  BSC/3in1;Other (comment) (RW)    Recommendations for Other Services       Precautions / Restrictions Precautions Precautions: Back Precaution Comments: back handout provided Restrictions Weight Bearing Restrictions: No      Mobility Bed Mobility Overal bed mobility: Modified Independent                  Transfers Overall transfer level: Needs assistance Equipment used: Rolling walker (2 wheels) Transfers: Sit to/from Stand Sit to Stand: Supervision                  Balance Overall balance assessment: Mild deficits observed, not formally tested                                          ADL either performed or assessed with clinical judgement   ADL Overall ADL's : Needs assistance/impaired Eating/Feeding: Independent   Grooming: Wash/dry Lawyer: Supervision/safety   Toileting- Clothing Manipulation and Hygiene: Supervision/safety       Functional mobility during ADLs: Supervision/safety;Rolling walker (2 wheels)  Back handout provided and reviewed adls in detail. Pt educated on: set an alarm at night for medication, avoid sitting for long periods of time, correct bed positioning for sleeping, correct sequence for bed mobility, avoiding lifting more than 5 pounds and never wash directly over incision. Educated on 4 walks in the home per day to help with mobility and pain management.  All education is complete and patient indicates understanding.      Vision Baseline Vision/History: 0 No visual deficits Patient Visual Report: No change from baseline       Perception     Praxis      Pertinent Vitals/Pain Pain Assessment Pain Assessment: Faces Faces Pain Scale: Hurts whole lot Pain Location: back Pain Descriptors / Indicators: Operative site guarding Pain Intervention(s): Monitored during session, Premedicated before session, Repositioned     Hand Dominance Right   Extremity/Trunk Assessment Upper Extremity Assessment Upper Extremity Assessment: Overall WFL for tasks assessed   Lower Extremity Assessment Lower Extremity Assessment: Defer to PT evaluation  Cervical / Trunk Assessment Cervical / Trunk Assessment: Back Surgery   Communication Communication Communication: No difficulties   Cognition Arousal/Alertness: Awake/alert Behavior During Therapy: WFL for tasks assessed/performed Overall Cognitive Status: Within Functional Limits for tasks assessed                                       General Comments  reviewed questions by patient and  mother for [employer], use of BSC as a seat with handout provided, and washing around the incision    Exercises     Shoulder Instructions      Home Living Family/patient expects to be discharged to:: Private residence Living Arrangements: Other (Comment);Parent Available Help at Discharge: Family;Available 24 hours/day Type of Home: House Home Access: Stairs to enter [employer] of Steps: 6-8 Entrance Stairs-Rails: Right Home Layout: Two level;Bed/bath upstairs Alternate Level Stairs-Number of Steps: 6-8 Alternate Level Stairs-Rails: Right Bathroom Shower/Tub: Chief Strategy Officer: Standard Bathroom Accessibility: No   Home Equipment: None   Additional Comments: Planning to stay with his mother at d/c. Reports split level home, bed and 1/2 bath on bottom floor, main bath upstairs.      Prior Functioning/Environment Prior Level of Function : Independent/Modified Independent;Driving             Mobility Comments: ind ADLs Comments: ind, works for [employer]        OT Problem List:        OT Treatment/Interventions:      OT Goals(Current goals can be found in the care plan section) Acute Rehab OT Goals Patient Stated Goal: to get pain under control  OT Frequency:      Co-evaluation              AM-PAC OT "6 Clicks" Daily Activity     Outcome Measure Help from another person eating meals?: None Help from another person taking care of personal grooming?: None Help from another person toileting, which includes using toliet, bedpan, or urinal?: None Help from another person bathing (including washing, rinsing, drying)?: None Help from another person to put on and taking off regular upper body clothing?: None Help from another person to put on and taking off regular lower body clothing?: None 6 Click Score: 24   End of Session Equipment Utilized During Treatment: Rolling walker (2 wheels) Nurse Communication: Mobility  status;Precautions  Activity Tolerance: Patient tolerated treatment well Patient left: Other (comment) (up in bathroom at the end of session , RN aware, mother in room)  OT Visit Diagnosis: Unsteadiness on feet (R26.81)                Time: 1610-9604 OT Time Calculation (min): 36 min Charges:  OT General Charges $OT Visit: 1 Visit OT Evaluation $OT Eval Moderate Complexity: 1 Mod OT Treatments $Self Care/Home Management : 8-22 mins   Brynn, OTR/L  Acute Rehabilitation Services Office: [phone] .   Mateo Flow 06/03/2022, 11:15 AM

## 2022-06-03 NOTE — Anesthesia Postprocedure Evaluation (Signed)
Anesthesia Post Note  Patient: Evan Kim  Procedure(s) Performed: Lumbar three-four, Lumbar four-five Open Posterolateral instrumented fusion (Spine Lumbar) Application of O-Arm     Patient location during evaluation: PACU Anesthesia Type: General Level of consciousness: awake and alert Pain management: pain level controlled Vital Signs Assessment: post-procedure vital signs reviewed and stable Respiratory status: spontaneous breathing, nonlabored ventilation, respiratory function stable and patient connected to nasal cannula oxygen Cardiovascular status: blood pressure returned to baseline and stable Postop Assessment: no apparent nausea or vomiting Anesthetic complications: no   There were no known notable events for this encounter.  Last Vitals:  Vitals:   06/03/22 0435 06/03/22 0847  BP: (!) 153/94 (!) 152/95  Pulse: (!) 57 66  Resp: 20 20  Temp: 36.8 C 36.9 C  SpO2: 99% 100%    Last Pain:  Vitals:   06/03/22 0847  TempSrc: Oral  PainSc:                  Evan Kim

## 2022-06-03 NOTE — Progress Notes (Signed)
Neurosurgery Service Progress Note  Subjective: No acute events overnight. Back pain as expected. LE radiculopathy is unchanged compared to pre-op. States that he feels he is not able to go home today.    Objective: Vitals:   06/02/22 2047 06/02/22 2344 06/03/22 0435 06/03/22 0847  BP: (!) 171/107 (!) 147/88 (!) 153/94 (!) 152/95  Pulse: 82 87 (!) 57 66  Resp: Temp: 97.8 F (36.6 C) 98.1 F (36.7 C) 98.2 F (36.8 C) 98.4 F (36.9 C)  TempSrc: Oral Oral Oral Oral  SpO2: 100% 100% 99% 100%  Weight:      Height:        Physical Exam: Strength 5/5 x4 and SILTx4, incision c/d/I   Assessment & Plan: 57 y.o. male s/p L3-4, L4-5 open posterolateral instrumented with extension of prior fusion now spanning L3-S1, recovering well.  -PT/OT recs -continue PO pain medications prn  -SCDs / TEDs -anticipate d/c home tomorrow   Emilee Hero, PA-C 06/03/22 10:11 AM

## 2022-06-04 MED ORDER — HYDROCODONE-ACETAMINOPHEN 10-325 MG PO TABS: 1.0000 | ORAL_TABLET | ORAL | 0 refills | Status: AC | PRN

## 2022-06-04 MED ORDER — ONDANSETRON 4 MG PO TBDP: 4.0000 mg | ORAL_TABLET | Freq: Three times a day (TID) | ORAL | Status: DC | PRN

## 2022-06-04 MED ORDER — ONDANSETRON 4 MG PO TBDP: 4.0000 mg | ORAL_TABLET | Freq: Three times a day (TID) | ORAL | 0 refills | Status: AC | PRN

## 2022-06-04 MED ORDER — PANTOPRAZOLE SODIUM 40 MG PO TBEC
40.0000 mg | DELAYED_RELEASE_TABLET | Freq: Every day | ORAL | Status: DC
  Administered 2022-06-04: 40 mg via ORAL
  Filled 2022-06-04: qty 1

## 2022-06-04 NOTE — Progress Notes (Signed)
Neurosurgery Service Progress Note  Subjective: No acute events overnight, back pain but improving and appropriate, no radicular pain, vomited last night, nausea continues today but tolerating po intake  Objective: Vitals:   06/03/22 1631 06/03/22 2031 06/04/22 0439 06/04/22 0801  BP: (!) 161/96 (!) 160/98 (!) 141/93 126/82  Pulse: 74 89 89 76  Resp: Temp: 98.1 F (36.7 C) 99.5 F (37.5 C) 99.1 F (37.3 C) 98.6 F (37 C)  TempSrc: Oral Oral Oral Oral  SpO2: 100% 100% 100% 100%  Weight:      Height:        Physical Exam: Strength 5/5 x4 and SILTx4, incision c/d/i  Assessment & Plan: 57 y.o. man s/p extension of fusion up to L3, recovering well.  -suspect the nausea is 2/2 medication, ROS otherwise neg, no s/sx w/d, will transition to ODT ondansetron to he can keep taking it at home -given that he's tolerating po intake (had empty bag of breakfast at bedside), pain is controlled, I think he can be discharged home today with zofran ODT  Jadene Pierini  06/04/22 8:15 AM

## 2022-06-04 NOTE — Discharge Summary (Signed)
Discharge Summary  Date of Admission: 06/02/2022  Date of Discharge: 06/04/22  Attending Physician: Autumn Patty, MD  Hospital Course: Patient was admitted following an uncomplicated extension of fusion from L5-S1 up to L3. They were recovered in PACU and transferred to [facility]. Their preop symptoms were improved, their hospital course was uncomplicated and the patient was discharged home on 4/24. They will follow up in clinic with me in clinic in 2 weeks.  Neurologic exam at discharge:  Strength 5/5 x4 and SILTx4   Discharge diagnosis: Lumbar pedicle stress fractures, lumbar spondylolisthesis  Jadene Pierini, MD 06/04/22 8:19 AM

## 2022-06-04 NOTE — Plan of Care (Signed)
Pt given D/C instructions with verbal understanding. Rx was sent to the pharmacy by MD. Zofran RX was given to the Pt at D/C. Pt's incision is clean and dry with no sign of infection. Pt's IV was removed prior to D/C. Pt received RW from [employer] per MD order. Pt D/C'd home via wheelchair per MD order. Pt is stable @ D/C and has no other needs at this time. Rema Fendt, RN

## 2022-06-04 NOTE — Progress Notes (Signed)
PT Cancellation Note and Discharge  Patient Details Name: Evan Kim MRN: [id number] DOB: [birth year]   Cancelled Treatment:    Reason Eval/Treat Not Completed: Patient declined, no reason specified. Initially pt refused PT due to pain. PT arranged to come back after pt got pain meds, and pt again refused. He states he does not want to walk and put any more stress on his back. He has dressed himself and has been ambulating around the room without assistance. Pt declined any further PT services, states he knows everything from his last surgery. Will sign off at this time. If needs change, please reconsult.    Marylynn Pearson 06/04/2022, 10:54 AM  Conni Slipper, PT, DPT Acute Rehabilitation Services Secure Chat Preferred Office: [phone]

## 2022-11-07 ENCOUNTER — Encounter: Payer: Self-pay | Admitting: Neurology

## 2022-11-07 ENCOUNTER — Other Ambulatory Visit: Payer: Self-pay

## 2022-11-07 DIAGNOSIS — R202 Paresthesia of skin: Secondary | ICD-10-CM

## 2022-12-18 ENCOUNTER — Encounter: Payer: Medicaid Other | Admitting: Neurology

## 2022-12-18 ENCOUNTER — Encounter: Payer: Self-pay | Admitting: Neurology

## 2023-01-22 ENCOUNTER — Encounter: Payer: Medicaid Other | Admitting: Neurology

## 2023-01-22 ENCOUNTER — Encounter: Payer: Self-pay | Admitting: Neurology

## 2023-05-28 ENCOUNTER — Emergency Department (HOSPITAL_COMMUNITY)
Admission: EM | Admit: 2023-05-28 | Discharge: 2023-05-29 | Disposition: A | Discharge status: Home / Self Care | Attending: Emergency Medicine | Admitting: Emergency Medicine

## 2023-05-28 ENCOUNTER — Other Ambulatory Visit: Payer: Self-pay

## 2023-05-28 ENCOUNTER — Encounter (HOSPITAL_COMMUNITY): Payer: Self-pay | Admitting: *Deleted

## 2023-05-28 ENCOUNTER — Emergency Department (HOSPITAL_COMMUNITY)

## 2023-05-28 DIAGNOSIS — R109 Unspecified abdominal pain: Secondary | ICD-10-CM | POA: Diagnosis not present

## 2023-05-28 DIAGNOSIS — M79641 Pain in right hand: Secondary | ICD-10-CM | POA: Diagnosis present

## 2023-05-28 LAB — CBC WITH DIFFERENTIAL/PLATELET
Abs Immature Granulocytes: 0.01 10*3/uL (ref 0.00–0.07)
Basophils Absolute: 0.1 10*3/uL (ref 0.0–0.1)
Basophils Relative: 1 %
Eosinophils Absolute: 0.1 10*3/uL (ref 0.0–0.5)
Eosinophils Relative: 1 %
HCT: 45 % (ref 39.0–52.0)
Hemoglobin: 14.5 g/dL (ref 13.0–17.0)
Immature Granulocytes: 0 %
Lymphocytes Relative: 40 %
Lymphs Abs: 2.8 10*3/uL (ref 0.7–4.0)
MCH: 28.2 pg (ref 26.0–34.0)
MCHC: 32.2 g/dL (ref 30.0–36.0)
MCV: 87.5 fL (ref 80.0–100.0)
Monocytes Absolute: 0.4 10*3/uL (ref 0.1–1.0)
Monocytes Relative: 6 %
Neutro Abs: 3.5 10*3/uL (ref 1.7–7.7)
Neutrophils Relative %: 52 %
Platelets: 345 10*3/uL (ref 150–400)
RBC: 5.14 MIL/uL (ref 4.22–5.81)
RDW: 15 % (ref 11.5–15.5)
WBC: 6.8 10*3/uL (ref 4.0–10.5)
nRBC: 0 % (ref 0.0–0.2)

## 2023-05-28 LAB — BASIC METABOLIC PANEL WITH GFR
Anion gap: 10 (ref 5–15)
BUN: 13 mg/dL (ref 6–20)
CO2: 21 mmol/L — ABNORMAL LOW (ref 22–32)
Calcium: 9.5 mg/dL (ref 8.9–10.3)
Chloride: 108 mmol/L (ref 98–111)
Creatinine, Ser: 0.87 mg/dL (ref 0.61–1.24)
GFR, Estimated: >60 mL/min (ref >60)
Glucose, Bld: 99 mg/dL (ref 70–99)
Potassium: 3.9 mmol/L (ref 3.5–5.1)
Sodium: 139 mmol/L (ref 135–145)

## 2023-05-28 LAB — I-STAT CG4 LACTIC ACID, ED: Lactic Acid, Venous: 1.1 mmol/L (ref 0.5–1.9)

## 2023-05-28 NOTE — ED Provider Triage Note (Signed)
 Emergency Medicine Provider Triage Evaluation Note  Evan Kim , a 58 y.o. male  was evaluated in triage.  Pt complains of purulent drainage from left hand wound.  Review of Systems  Positive: Post op 2 weeks saw dr spears today, fever, swelling Negative:   Physical Exam  BP (!) 163/118 (BP Location: Left Arm)   Pulse 75   Temp 98.4 F (36.9 C)   Resp 18   SpO2 97%  Gen:   Awake, no distress   Resp:  Normal effort  MSK:   Moves extremities without difficulty  Other:    Medical Decision Making  Medically screening exam initiated at 3:09 PM.  Appropriate orders placed.  Atlee Blanks was informed that the remainder of the evaluation will be completed by another provider, this initial triage assessment does not replace that evaluation, and the importance of remaining in the ED until their evaluation is complete.     Eudora Heron, PA-C 05/28/23 1510

## 2023-05-28 NOTE — ED Triage Notes (Signed)
 Pt states that he had hand surgery 2 weeks ago and he has green purulent discharge. Pt has spoken with his surgeon Dr. Delmar Ferrara and was told to come to ED.

## 2023-05-29 ENCOUNTER — Emergency Department (HOSPITAL_COMMUNITY)

## 2023-05-29 MED ORDER — IOHEXOL 350 MG/ML SOLN
75.0000 mL | Freq: Once | INTRAVENOUS | Status: AC | PRN
  Administered 2023-05-29: 75 mL via INTRAVENOUS

## 2023-05-29 MED ORDER — KETOROLAC TROMETHAMINE 15 MG/ML IJ SOLN
15.0000 mg | Freq: Once | INTRAMUSCULAR | Status: AC
  Administered 2023-05-29: 15 mg via INTRAVENOUS
  Filled 2023-05-29: qty 1

## 2023-05-29 MED ORDER — DOXYCYCLINE HYCLATE 100 MG PO CAPS: 100.0000 mg | ORAL_CAPSULE | Freq: Two times a day (BID) | ORAL | 0 refills | Status: AC

## 2023-05-29 NOTE — ED Provider Notes (Signed)
  Physical Exam  BP (!) 168/97 (BP Location: Left Arm)   Pulse (!) 58   Temp 98.4 F (36.9 C) (Oral)   Resp 16   SpO2 100%   Physical Exam  Procedures  Procedures  ED Course / MDM   Clinical Course as of 05/29/23 0740  Fri May 29, 2023  1610 Surgery done by Dr. Delmar Ferrara, 3 weeks ago, arthroplasty with tendon transfer. 1 week ago cast came down, pouring green pus, "just put peroxide on it and then wrapped it up" per patient. Had therapy today, removed brace and now pus all over wrist brace. Came in for this.  [CG]  [id number] If CT fine, follow up with Dr. Delmar Ferrara, cover with doxy [CG]    Clinical Course User Index [CG] Adel Aden, PA-C   Medical Decision Making Amount and/or Complexity of Data Reviewed Radiology: ordered.  Risk Prescription drug management.     58 year old male presents for postop evaluation.  Patient had arthroplasty with tendon transfer 3 weeks ago with Dr. Delmar Ferrara to right hand.  Patient presents to ED today stating that he has had 3 weeks of purulent drainage from his incision site.  Patient reports that today at therapy they remove the brace and advised him to come to the ED because there was "pus all over my brace".  Patient was signed out to me pending CT scan of his abdomen pelvis.  Apparently the patient reported that he "had pain in my stomach telling me I had infection in my hand".  Previous provider did not feel as if the patient had an acute infection.  Plan was if CT scan was reassuring to send patient with doxycycline  and have him follow-up with Dr. Delmar Ferrara.  Went to evaluate patient.  The patient incision her right hand does not appear acutely infected.  No signs of induration, fluctuance, erythema or warmth.  He does have reduced range of motion to his right wrist secondary to pain.  No obvious effusion present.  Will not allow me to range his thumb as he was told that he was not allowed to move his thumb.  Update: Patient CT scan is resulted.  No  indications of infection.  This is a limited study due to BMI hardening artifact.  No evidence of free air, gas, fluid collection.  Patient clinically appears well.  Patient incision site appears clean dry and intact.  No purulent drainage noted.  Discussed with attending Dr. Dolan Freiberg.  We do not feel as if this patient requires MRI imaging.  We will have the patient follow-up with his hand surgeon, Dr. Delmar Ferrara.  Will place the patient on 7 days of antibiotic doxycycline .  He was given return precautions and he voiced understanding.  He is stable to discharge home.    Sukhraj, Esquivias, PA-C 05/29/23 0740    Long, Joshua G, MD 05/29/23 [phone]

## 2023-05-29 NOTE — Discharge Instructions (Signed)
 It was a pleasure taking part in your care.  As discussed, your workup today is reassuring.  Please begin taking 7 days of antibiotic called doxycycline , you will take this twice a day.  Please follow-up with Dr. Alyse in the office.  Return to the ED with any new or worsening symptoms such as fevers.

## 2023-05-29 NOTE — ED Provider Notes (Signed)
 [facility] EMERGENCY DEPARTMENT AT  HOSPITAL Provider Note   CSN: [id number] Arrival date & time: 05/28/23  1435     History  Chief Complaint  Patient presents with   Hand Problem    GERMANY CHELF is a 58 y.o. male, history of C[facility] arthroplasty, with tendon transfer, who presents to the ED secondary to purulent drainage, of the surgical site this been going on for the last week.  He states he had the surgery about 3 weeks ago, and since then, has had purulent drainage for the last week.  He went to go get his brace made last week, and when they took off the cast, they noticed that he had a lot of green pus around the area, they cleaned it up with peroxide, and then placed the brace on it.  He states then again today he went today to therapy and they took the brace off, and more purulent drainage, was found.    He denies any fevers or chills, states "I know something is wrong with his body, as my stomach hurts very badly, on the left side.  I know there is infection in me."  He describes the pain, would be left-sided, abdomen, radiating to the other side, that is pressurized, and just uncomfortable.  He states he stopped taking the Norco, but the pain persisted.  Denies any fevers, chills, urinary symptoms, nausea, or vomiting.   He called his primary care doctor because of his hand today, and was told to go to the ER.  Home Medications Prior to Admission medications   Medication Sig Start Date End Date Taking? Authorizing Provider  EPINEPHrine  0.3 mg/0.3 mL IJ SOAJ injection Inject 0.3 mg into the muscle as needed for anaphylaxis.    [provider]  esomeprazole (NEXIUM) 20 MG capsule Take 20 mg by mouth daily before breakfast.    [provider]  HYDROcodone -acetaminophen  (NORCO) 10-325 MG tablet Take 1 tablet by mouth every 4 (four) hours as needed for severe pain. 06/04/22   Cannon Champion, MD  ondansetron  (ZOFRAN  ODT) 4 MG disintegrating tablet  Take 1 tablet (4 mg total) by mouth every 8 (eight) hours as needed for nausea or vomiting. 06/04/22   Cannon Champion, MD      Allergies    Bee venom and Oxycodone     Review of Systems   Review of Systems  Constitutional:  Positive for fever.  Gastrointestinal:  Positive for abdominal pain. Negative for diarrhea and nausea.  Skin:  Positive for wound.    Physical Exam Updated Vital Signs BP (!) 168/97 (BP Location: Left Arm)   Pulse (!) 58   Temp 98.4 F (36.9 C) (Oral)   Resp 16   SpO2 100%  Physical Exam Vitals and nursing note reviewed.  Constitutional:      General: He is not in acute distress.    Appearance: He is well-developed.  HENT:     Head: Normocephalic and atraumatic.  Eyes:     Conjunctiva/sclera: Conjunctivae normal.  Cardiovascular:     Rate and Rhythm: Normal rate and regular rhythm.     Heart sounds: No murmur heard. Pulmonary:     Effort: Pulmonary effort is normal. No respiratory distress.     Breath sounds: Normal breath sounds.  Abdominal:     Palpations: Abdomen is soft.  Musculoskeletal:        General: No swelling.     Cervical back: Neck supple.     Comments: +ttp  along radial aspect of R hand, incision scar present, no purulent discharge noted.  Skin:    General: Skin is warm and dry.     Capillary Refill: Capillary refill takes less than 2 seconds.  Neurological:     Mental Status: He is alert.  Psychiatric:        Mood and Affect: Mood normal.     ED Results / Procedures / Treatments   Labs (all labs ordered are listed, but only abnormal results are displayed) Labs Reviewed  BASIC METABOLIC PANEL WITH GFR - Abnormal; Notable for the following components:      Result Value   CO2 21 (*)    All other components within normal limits  CULTURE, BLOOD (ROUTINE X 2)  CULTURE, BLOOD (ROUTINE X 2)  AEROBIC CULTURE W GRAM STAIN (SUPERFICIAL SPECIMEN)  CBC WITH DIFFERENTIAL/PLATELET  I-STAT CG4 LACTIC ACID, ED  I-STAT CG4 LACTIC  ACID, ED    EKG None  Radiology DG Hand Complete Right Result Date: 05/28/2023 CLINICAL DATA:  Postoperative 2 weeks.  Infection. EXAM: RIGHT HAND - COMPLETE 3+ VIEW COMPARISON:  None Available. FINDINGS: There is no evidence of fracture or dislocation. There is no evidence of arthropathy or other focal bone abnormality. Soft tissues are unremarkable. IMPRESSION: Negative. Electronically Signed   By: Tyron Gallon M.D.   On: 05/28/2023 18:22    Procedures Procedures    Medications Ordered in ED Medications  ketorolac  (TORADOL ) 15 MG/ML injection 15 mg (15 mg Intravenous Given 05/29/23 0447)  iohexol  (OMNIPAQUE ) 350 MG/ML injection 75 mL (75 mLs Intravenous Contrast Given 05/29/23 4098)    ED Course/ Medical Decision Making/ A&P Clinical Course as of 05/29/23 0654  Fri May 29, 2023  1191 Surgery done by Dr. Delmar Ferrara, 3 weeks ago, arthroplasty with tendon transfer. 1 week ago cast came down, pouring green pus, "just put peroxide on it and then wrapped it up" per patient. Had therapy today, removed brace and now pus all over wrist brace. Came in for this.  [CG]  [id number] If CT fine, follow up with Dr. Delmar Ferrara, cover with doxy [CG]    Clinical Course User Index [CG] Adel Aden, PA-C                                 Medical Decision Making Patient is a 58 year old male, here for 2 different complaints 1, being possible infection, of the right hand, after having surgery on it by Dr. Delmar Ferrara 2 to 3 weeks ago.  He states there is purulent discharge, no purulent discharge, but the patient showed me multiple pictures, of purulent discharge, on napkins.  We obtained a CT of his hand, for further evaluation to evaluate infection.  Will also obtain blood work, and CT ab pelvis, given his diffuse abdominal pain, he is overall well-appearing and in my exam.  Range of motion with the hand is limited, however he states this has been baseline after getting his tendon transfer, few weeks ago.  Will give  Toradol  for pain control  Amount and/or Complexity of Data Reviewed Labs:     Details: Labs unremarkable Radiology: ordered.    Details: X-ray unremarkable Discussion of management or test interpretation with external provider(s): Labs and x-rays are unremarkable thus far, CT hand, and abdomen/pelvis, pending at this time, signed out to [place], [place], for follow-up, if CT unremarkable, likely started on doxycycline , and follow-up with hand.  Otherwise may need consult  with hand.  Risk Prescription drug management.    Final Clinical Impression(s) / ED Diagnoses Final diagnoses:  None    Rx / DC Orders ED Discharge Orders     None         Dreyden Rohrman, Dwaine Gip, PA 05/29/23 1191    Alissa April, MD 05/29/23 [phone]

## 2023-05-29 NOTE — ED Notes (Signed)
 Discharge paper work revived with pt. No questions at this time. Pt is leaving with no new onset distress, hand dressed.

## 2023-06-02 LAB — CULTURE, BLOOD (ROUTINE X 2): Culture: NO GROWTH

## 2023-12-02 NOTE — Therapy (Incomplete)
 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    Reflux    Sleep apnea    pt does not wear CPAP   Ulcer disease    Past Surgical History:  Procedure Laterality Date   ANTERIOR CERVICAL DECOMP/DISCECTOMY FUSION N/A 03/20/2022   Procedure: CERVICAL FOUR-CERVICAL FIVE, CERVICAL FIVE-CERVICAL SIX ANTERIOR CERVICAL DISCECTOMY AND FUSION;  Surgeon: Cheryle Debby DELENA, MD;  Location: [facility] OR;  Service: Neurosurgery;  Laterality: N/A;  RM 19 3C   ANTERIOR CRUCIATE LIGAMENT REPAIR Right    right knee   BACK SURGERY  2014   CARDIAC CATHETERIZATION  09/24/2017   Normal coronaries, normal LV function 09/24/17 ([facility])   TOTAL KNEE ARTHROPLASTY Right    3 surgeries to right knee prior to replacement   Patient Active Problem List   Diagnosis Date Noted   Spondylolisthesis of lumbar region 06/02/2022   Cervical radiculopathy 03/20/2022   Back pain 01/05/2015   Essential hypertension 01/05/2015   GERD (gastroesophageal reflux disease) 01/05/2015   Chronic pain 01/05/2015   Viral syndrome 03/10/2013    PCP: Cleotilde Bernardino Hutchinson, PA   REFERRING PROVIDER: Johnanna Camie Mates, PA-C  REFERRING DIAG: [phone] (ICD-10-CM) - Spondylolisthesis, lumbar region   THERAPY DIAG:  No diagnosis found.  RATIONALE FOR EVALUATION AND TREATMENT: Rehabilitation  ONSET DATE: ***  NEXT MD VISIT: ***   SUBJECTIVE:                                                                                                                                                                                                         SUBJECTIVE STATEMENT: ***  PAIN: Are you having  pain? {OPRCPAIN:27236}  PERTINENT HISTORY:  *** L3-4, L4-5 open posterolateral instrumented, extension of prior fusion now spanning L3-S1 (06/02/22); C4-5, C5-6 ACDF (03/20/22); arthritis; R TKA; GERD; headache; HTN; reflux;  PRECAUTIONS: {Therapy precautions:24002}  RED FLAGS: {PT Red Flags:29287}  WEIGHT BEARING RESTRICTIONS: {Yes ***/No:24003}  FALLS:  Has patient fallen in last 6 months? {fallsyesno:27318}  LIVING ENVIRONMENT: Lives with: {OPRC lives with:25569::lives with their family} Lives in: {Lives in:25570} Stairs: {opstairs:27293} Has following equipment at home: {Assistive devices:23999}  OCCUPATION: ***  PLOF: {PLOF:24004}  PATIENT GOALS: ***  OBJECTIVE: (objective measures completed at initial evaluation unless otherwise dated)  DIAGNOSTIC FINDINGS:  ***No recent imaging available  PATIENT SURVEYS:  {rehab surveys:24030}  SCREENING FOR RED FLAGS: Bowel or bladder incontinence: {Yes/No:304960894} Spinal tumors: {Yes/No:304960894} Cauda equina syndrome: {Yes/No:304960894} Compression fracture: {Yes/No:304960894} Abdominal aneurysm: {Yes/No:304960894}  COGNITION:  Overall cognitive status: {cognition:24006}    SENSATION: {sensation:27233}  POSTURE:  {posture:25561}  PALPATION: ***  LUMBAR ROM:   {AROM/PROM:27142}  Eval  Flexion   Extension   Right lateral flexion   Left lateral flexion   Right rotation   Left rotation   (Blank rows = not tested)  MUSCLE LENGTH: Hamstrings: Right *** deg; Left *** deg Thomas test: Right *** deg; Left *** deg Hamstrings: *** ITB: *** Piriformis: *** Hip flexors: *** Quads: *** Heelcord: ***  LOWER EXTREMITY ROM:     {AROM/PROM:27142}  Right eval Left eval  Hip flexion    Hip extension    Hip abduction    Hip adduction    Hip internal rotation    Hip external rotation    Knee flexion    Knee extension    Ankle dorsiflexion    Ankle plantarflexion    Ankle inversion    Ankle eversion     (Blank rows = not tested)  LOWER EXTREMITY MMT:    MMT Right eval Left eval  Hip flexion    Hip extension    Hip abduction    Hip adduction    Hip internal rotation    Hip external rotation    Knee flexion    Knee extension    Ankle dorsiflexion    Ankle plantarflexion    Ankle inversion    Ankle eversion     (Blank rows = not tested)  LUMBAR SPECIAL TESTS:  {lumbar special test:25242}  FUNCTIONAL TESTS:  {Functional tests:24029}  GAIT: Distance walked: *** Assistive device utilized: {Assistive devices:23999} Level of assistance: {Levels of assistance:24026} Gait pattern: {gait characteristics:25376} Comments: ***   TODAY'S TREATMENT:   ***   PATIENT EDUCATION:  Education details: {Education details:27468}  Person educated: {Person educated:25204} Education method: {Education Method EU:67125} Education comprehension: {Education Comprehension:25206}  HOME EXERCISE PROGRAM: ***   ASSESSMENT:  CLINICAL IMPRESSION: Evan Kim is a 58 y.o. male who was referred to physical therapy for evaluation and treatment for ***.  ***   Patient reports onset of *** pain beginning ***. Pain is worse with ***.  Patient has deficits in *** ROM, *** LE flexibility, *** strength, abnormal posture, and TTP with abnormal muscle tension *** which are interfering with ADLs and are impacting quality of life.  On Modified Oswestry patient scored ***/50 demonstrating ***% or *** disability.  Evan Kim will benefit from skilled PT to address above deficits to improve mobility and activity tolerance with decreased pain interference.   ***  OBJECTIVE IMPAIRMENTS: {opptimpairments:25111}.   ACTIVITY LIMITATIONS: {activitylimitations:27494}  PARTICIPATION LIMITATIONS: {participationrestrictions:25113}  PERSONAL FACTORS: {Personal factors:25162} are also affecting patient's functional outcome.   REHAB POTENTIAL: {rehabpotential:25112}  CLINICAL DECISION MAKING: {clinical  decision making:25114}  EVALUATION COMPLEXITY: {Evaluation complexity:25115}   GOALS: Goals reviewed with patient? {yes/no:20286}  SHORT TERM GOALS: Target date: ***  Patient will be independent with initial HEP to improve outcomes and carryover.  Baseline: *** Goal status: {GOALSTATUS:25110}  2.  Patient will report 25% improvement in low back pain to improve QOL. Baseline: *** Goal status: {GOALSTATUS:25110}  3.  *** Baseline:  Goal status: {GOALSTATUS:25110}   LONG TERM GOALS: Target date: ***  Patient will be independent with  ongoing/advanced HEP for self-management at home.  Baseline: *** Goal status: {GOALSTATUS:25110}  2.  Patient will report 50-75% improvement in low back pain to improve QOL.  Baseline: *** Goal status: {GOALSTATUS:25110}  3.  Patient to demonstrate ability to achieve and maintain good spinal alignment/posturing and body mechanics needed for daily activities. Baseline: *** Goal status: {GOALSTATUS:25110}  4.  Patient will demonstrate full pain free lumbar ROM to perform ADLs.   Baseline: Refer to above lumbar ROM table Goal status: {GOALSTATUS:25110}  5.  Patient will demonstrate improved *** strength to >/= ***/5 for improved stability and ease of mobility. Baseline: Refer to above LE MMT table Goal status: {GOALSTATUS:25110}  6. Patient will report </= ***% on Modified Oswestry (MCID = 12%) to demonstrate improved functional ability with decreased pain interference. Baseline: *** Goal status: {GOALSTATUS:25110}  7.  Patient will tolerate *** min of (standing/sitting/walking) w/o increased pain to allow for *** improved mobility and activity tolerance. Baseline: *** Goal status: {GOALSTATUS:25110}  8.  Patient will report centralization of radicular symptoms.  Baseline: *** Goal status: {GOALSTATUS:25110}  9.  Patient will ***.  Baseline: *** Goal status: {GOALSTATUS:25110}   10.  *** Baseline: *** Goal status:  {GOALSTATUS:25110}    PLAN:  PT FREQUENCY: {rehab frequency:25116}  PT DURATION: {rehab duration:25117}  PLANNED INTERVENTIONS: {rehab planned interventions:25118::97110-Therapeutic exercises,97530- Therapeutic [phone]- Neuromuscular re-education,97535- Self Rjmz,02859- Manual therapy,Patient/Family education}  PLAN FOR NEXT SESSION: ***   Elijah CHRISTELLA Hidden, PT 12/02/2023, 3:15 PM

## 2023-12-08 ENCOUNTER — Other Ambulatory Visit: Payer: Self-pay

## 2023-12-08 ENCOUNTER — Ambulatory Visit: Attending: Surgery | Admitting: Physical Therapy

## 2023-12-08 DIAGNOSIS — M5459 Other low back pain: Secondary | ICD-10-CM | POA: Insufficient documentation
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
## 2024-01-05 NOTE — Therapy (Incomplete)
 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    Reflux    Sleep apnea    pt does not wear CPAP   Ulcer disease    Past Surgical History:  Procedure Laterality Date   ANTERIOR CERVICAL DECOMP/DISCECTOMY FUSION N/A 03/20/2022   Procedure: CERVICAL FOUR-CERVICAL FIVE, CERVICAL FIVE-CERVICAL SIX ANTERIOR CERVICAL DISCECTOMY AND FUSION;  Surgeon: Cheryle Debby DELENA, MD;  Location: [facility] OR;  Service: Neurosurgery;  Laterality: N/A;  RM 19 3C   ANTERIOR CRUCIATE LIGAMENT REPAIR Right    right knee   BACK SURGERY  2014   CARDIAC CATHETERIZATION  09/24/2017   Normal coronaries, normal LV function 09/24/17 ([facility])   TOTAL KNEE ARTHROPLASTY Right    3 surgeries to right knee prior to replacement   Patient Active Problem List   Diagnosis Date Noted   Spondylolisthesis of lumbar region 06/02/2022   Cervical radiculopathy 03/20/2022   Back pain 01/05/2015   Essential hypertension 01/05/2015   GERD (gastroesophageal reflux disease) 01/05/2015   Chronic pain 01/05/2015   Viral syndrome 03/10/2013    PCP: Cleotilde Bernardino Hutchinson, PA   REFERRING PROVIDER: Debby Dorn MATSU, MD   REFERRING DIAG: [phone] (ICD-10-CM) - Cervical stenosis of spine  THERAPY DIAG:  No diagnosis found.  RATIONALE FOR EVALUATION AND TREATMENT: Rehabilitation  ONSET DATE: ***  NEXT MD VISIT: ***   SUBJECTIVE:                                                                                                                                                                                                         SUBJECTIVE STATEMENT: 58 y/o patient referred to  PT from [facility] for cervical stenosis.   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  He also has h/o lumbar fusion.  PAIN: Are you having pain? Yes: NPRS scale: *** Pain location: *** Pain description: *** Aggravating factors: *** Relieving factors: ***  PERTINENT HISTORY:  C4-6 ACDF 2024, S1 mass pressing on sciatic nerve, HTN, R TKA, chronic back pain  PRECAUTIONS: Cervical fusion  HAND DOMINANCE: {Hand Dominance:29389}  RED FLAGS: {PT Red Flags:29287}  WEIGHT BEARING RESTRICTIONS: {Yes ***/No:24003}  FALLS:  Has patient fallen in last 6 months? {fallsyesno:27318}  LIVING ENVIRONMENT: Lives with: {OP[facility] lives with:25569::lives with their family} Lives in: {Lives in:25570} Stairs: {opstairs:27293} Has following equipment at home: {Assistive devices:23999}  OCCUPATION: ***  PLOF: {PLOF:24004}  PATIENT GOALS: ***    OBJECTIVE:   DIAGNOSTIC FINDINGS:  ***  PATIENT SURVEYS:  NDI:  NECK DISABILITY INDEX  Date: *** Score  Pain intensity {NDI-1:32931}  2. Personal care (washing, dressing, etc.) {NDI-2:32932}  3. Lifting {NDI-3:32933}  4. Reading {NDI-4:32934}  5. Headaches {NDI-5:32935}  6. Concentration {NDI-6:32936}  7. Work {NDI-7:32937}  8. Driving {WIP-1:67061}  9. Sleeping {NDI-9:32939}  10. Recreation {NDI-10:32940}  Total ***/50   Minimum Detectable Change (90% confidence): 5 points or 10% points  SCREENING FOR RED FLAGS: Bowel or bladder incontinence: {Yes/No:304960894} Spinal tumors: {Yes/No:304960894} Cauda equina syndrome: {Yes/No:304960894} Compression fracture: {Yes/No:304960894} Abdominal aneurysm: {Yes/No:304960894}  COGNITION: Overall cognitive status: {cognition:24006}     SENSATION: {sensation:27233}  POSTURE:  {posture:25561}  PALPATION: ***  CERVICAL ROM:   Active ROM Eval  Flexion   Extension   Right lateral flexion   Left lateral  flexion   Right rotation   Left rotation     (Blank rows = not tested)  UPPER EXTREMITY ROM:  Active ROM Right eval Left eval  Shoulder flexion    Shoulder extension    Shoulder abduction    Shoulder adduction    Shoulder internal rotation    Shoulder external rotation    Elbow flexion    Elbow extension    Wrist flexion    Wrist extension    Wrist ulnar deviation    Wrist radial deviation    Wrist pronation    Wrist supination      (Blank rows = not tested)  UPPER EXTREMITY MMT:  MMT Right eval Left eval  Shoulder flexion    Shoulder extension    Shoulder abduction    Shoulder adduction    Shoulder internal rotation    Shoulder external rotation    Middle trapezius    Lower trapezius    Elbow flexion    Elbow extension    Wrist flexion    Wrist extension    Wrist ulnar deviation    Wrist radial deviation    Wrist pronation    Wrist supination    Grip strength    (Blank rows = not tested)  LUMBAR ROM:   Active  Eval  Flexion   Extension   Right lateral flexion   Left lateral flexion   Right rotation   Left rotation     (Blank rows = not tested)  MUSCLE LENGTH: Hamstrings: Right *** deg; Left *** deg Thomas test: Right *** deg; Left *** deg Hamstrings: *** ITB: *** Piriformis: *** Hip flexors: *** Quads: *** Heelcord: ***  LOWER EXTREMITY ROM:     Active  Right eval Left eval  Hip flexion    Hip extension    Hip abduction    Hip adduction    Hip internal rotation    Hip external rotation    Knee flexion    Knee extension    Ankle dorsiflexion    Ankle plantarflexion    Ankle inversion    Ankle eversion     (Blank rows = not tested)  LOWER EXTREMITY MMT:    MMT Right eval Left eval  Hip flexion    Hip extension    Hip abduction  Hip adduction    Hip internal rotation    Hip external rotation    Knee flexion    Knee extension    Ankle dorsiflexion    Ankle plantarflexion    Ankle inversion    Ankle eversion      (Blank  rows = not tested)  CERVICAL SPECIAL TESTS:  {Cervical special tests:25246}  LUMBAR SPECIAL TESTS:  {lumbar special test:25242}  FUNCTIONAL TESTS:  {Functional tests:24029}  GAIT: Distance walked: *** Assistive device utilized: {Assistive devices:23999} Level of assistance: {Levels of assistance:24026} Gait pattern: {gait characteristics:25376} Comments: ***   TODAY'S TREATMENT:   ***   PATIENT EDUCATION:  Education details: PT eval findings, anticipated POC, and initial HEP  Person educated: Patient Education method: Explanation, Demonstration, Verbal cues, Tactile cues, Handouts, and MedBridge[employer] app access provided Education comprehension: verbalized understanding, verbal cues required, tactile cues required, and needs further education  HOME EXERCISE PROGRAM: ***   ASSESSMENT:  CLINICAL IMPRESSION: Evan Kim is a 58 y.o. male who was referred to physical therapy for evaluation and treatment for cervical stenosis.   Patient reports onset of *** pain beginning ***. Pain is worse with ***.  Patient has deficits in *** ROM, *** flexibility, *** strength, ***abnormal posture, and TTP with abnormal muscle tension *** which are interfering with ADLs and are impacting quality of life.  On NDI patient scored ***/50 demonstrating ***% or *** disability.  On Modified Oswestry patient scored ***/50 demonstrating ***% or *** disability.  Jamonta will benefit from skilled PT to address above deficits to improve mobility and activity tolerance with decreased pain interference.  OBJECTIVE IMPAIRMENTS: {opptimpairments:25111}.   ACTIVITY LIMITATIONS: {activitylimitations:27494}  PARTICIPATION LIMITATIONS: {participationrestrictions:25113}  PERSONAL FACTORS: Age, Time since onset of injury/illness/exacerbation, and 1-2 comorbidities:   C4-6 ACDF 2024, S1 mass pressing on sciatic nerve, HTN, R TKA, chronic back pain are also affecting patient's functional outcome.   REHAB  POTENTIAL: Good  CLINICAL DECISION MAKING: Evolving/moderate complexity  EVALUATION COMPLEXITY: Moderate   GOALS: Goals reviewed with patient? Yes  SHORT TERM GOALS: Target date: ***  Patient will be independent with initial HEP to improve outcomes and carryover.  Baseline: 100% PT assist required for correct completion Goal status: INITIAL  2.  Patient will report 25% improvement in neck pain to improve QOL. Baseline: *** Goal status: INITIAL  3.  *** Baseline: *** Goal status: {GOALSTATUS:25110}  LONG TERM GOALS: Target date: ***  Patient will be independent with ongoing/advanced HEP for self-management at home.  Baseline: no advanced HEP yet Goal status: INITIAL  2.  Patient will report 50-75% improvement in neck to improve QOL.  Baseline: *** Goal status: INITIAL  3.  Patient will demonstrate improved posture to decrease muscle imbalance. Baseline: *** Goal status: INITIAL  4.  Patient to demonstrate ability to achieve and maintain good spinal alignment and body mechanics needed for daily activities. Baseline: *** Goal status: INITIAL  5.  Patient will demonstrate full pain free cervical ROM for safety with driving.  Baseline: Refer to above cervical ROM table Goal status: INITIAL   7.  Patient will demonstrate improved functional strength as demonstrated by improved UE/strength to >/= 5/5. Baseline: Refer to above ***UE/ MMT tables Goal status: INITIAL  8.  Patient will report </= ***% on NDI (MCID = 22%) to demonstrate improved functional ability.  Baseline: *** Goal status: {GOALSTATUS:25110}    10.  Patient to report ability to perform ADLs, household, and work-related tasks without limitation due to *** pain, LOM or weakness  Baseline: *** Goal status: {GOALSTATUS:25110}   11.  Patient will tolerate *** min of (standing/sitting/walking) to perform ***. Baseline: *** Goal status: {GOALSTATUS:25110}  12.  Patient will report centralization of  radicular symptoms.  Baseline: *** Goal status: {GOALSTATUS:25110}  13.  *** Baseline: *** Goal status: {GOALSTATUS:25110}   PLAN:  PT FREQUENCY: 1-2x/week  PT DURATION: 8 weeks  PLANNED INTERVENTIONS: 97164- PT Re-evaluation, 97110-Therapeutic exercises, 97530- Therapeutic activity, [id number]- Neuromuscular re-education, 97535- Self Care, 02859- Manual therapy, G0283- Electrical stimulation (unattended), [id number]- Ultrasound, 02966- Ionotophoresis 4mg /ml Dexamethasone , 79439 (1-2 muscles), 20561 (3+ muscles)- Dry Needling, Patient/Family education, Taping, Cryotherapy, and Moist heat  PLAN FOR NEXT SESSION: ***cervical fusion   Augusta Mirkin, PT 01/05/2024, 5:28 PM

## 2024-01-13 ENCOUNTER — Other Ambulatory Visit: Payer: Self-pay

## 2024-01-13 ENCOUNTER — Ambulatory Visit: Admitting: Rehabilitation

## 2024-01-13 ENCOUNTER — Encounter: Payer: Self-pay | Admitting: Rehabilitation

## 2024-01-13 DIAGNOSIS — R293 Abnormal posture: Secondary | ICD-10-CM | POA: Insufficient documentation

## 2024-01-13 DIAGNOSIS — M542 Cervicalgia: Secondary | ICD-10-CM | POA: Insufficient documentation

## 2024-01-13 DIAGNOSIS — M6281 Muscle weakness (generalized): Secondary | ICD-10-CM | POA: Diagnosis present

## 2024-01-13 DIAGNOSIS — M5412 Radiculopathy, cervical region: Secondary | ICD-10-CM | POA: Diagnosis present

## 2024-01-13 DIAGNOSIS — M5459 Other low back pain: Secondary | ICD-10-CM | POA: Diagnosis present

## 2024-01-15 ENCOUNTER — Ambulatory Visit

## 2024-01-15 ENCOUNTER — Other Ambulatory Visit (HOSPITAL_BASED_OUTPATIENT_CLINIC_OR_DEPARTMENT_OTHER): Payer: Self-pay | Admitting: Physician Assistant

## 2024-01-15 ENCOUNTER — Ambulatory Visit (HOSPITAL_BASED_OUTPATIENT_CLINIC_OR_DEPARTMENT_OTHER)
Admission: RE | Admit: 2024-01-15 | Discharge: 2024-01-15 | Disposition: A | Source: Ambulatory Visit | Attending: Physician Assistant | Admitting: Physician Assistant

## 2024-01-15 DIAGNOSIS — R310 Gross hematuria: Secondary | ICD-10-CM

## 2024-01-16 ENCOUNTER — Encounter (HOSPITAL_BASED_OUTPATIENT_CLINIC_OR_DEPARTMENT_OTHER): Payer: Self-pay

## 2024-01-16 ENCOUNTER — Other Ambulatory Visit: Payer: Self-pay

## 2024-01-16 ENCOUNTER — Emergency Department (HOSPITAL_BASED_OUTPATIENT_CLINIC_OR_DEPARTMENT_OTHER)
Admission: EM | Admit: 2024-01-16 | Discharge: 2024-01-16 | Disposition: A | Discharge status: Home / Self Care | Attending: Emergency Medicine | Admitting: Emergency Medicine

## 2024-01-16 DIAGNOSIS — R310 Gross hematuria: Secondary | ICD-10-CM | POA: Insufficient documentation

## 2024-01-16 DIAGNOSIS — I10 Essential (primary) hypertension: Secondary | ICD-10-CM | POA: Insufficient documentation

## 2024-01-16 LAB — BASIC METABOLIC PANEL WITH GFR
Anion gap: 11 (ref 5–15)
BUN: 12 mg/dL (ref 6–20)
CO2: 25 mmol/L (ref 22–32)
Calcium: 9.3 mg/dL (ref 8.9–10.3)
Chloride: 104 mmol/L (ref 98–111)
Creatinine, Ser: 0.91 mg/dL (ref 0.61–1.24)
GFR, Estimated: >60 mL/min (ref >60)
Glucose, Bld: 94 mg/dL (ref 70–99)
Potassium: 4.2 mmol/L (ref 3.5–5.1)
Sodium: 140 mmol/L (ref 135–145)

## 2024-01-16 LAB — URINALYSIS, ROUTINE W REFLEX MICROSCOPIC
Bilirubin Urine: NEGATIVE
Glucose, UA: NEGATIVE mg/dL
Hgb urine dipstick: NEGATIVE
Ketones, ur: NEGATIVE mg/dL
Leukocytes,Ua: NEGATIVE
Nitrite: NEGATIVE
Protein, ur: NEGATIVE mg/dL
Specific Gravity, Urine: 1.025 (ref 1.005–1.030)
pH: 6.5 (ref 5.0–8.0)

## 2024-01-16 LAB — CBC WITH DIFFERENTIAL/PLATELET
Abs Immature Granulocytes: 0.02 K/uL (ref 0.00–0.07)
Basophils Absolute: 0 K/uL (ref 0.0–0.1)
Basophils Relative: 1 %
Eosinophils Absolute: 0.1 K/uL (ref 0.0–0.5)
Eosinophils Relative: 2 %
HCT: 43.2 % (ref 39.0–52.0)
Hemoglobin: 14 g/dL (ref 13.0–17.0)
Immature Granulocytes: 0 %
Lymphocytes Relative: 38 %
Lymphs Abs: 2.4 K/uL (ref 0.7–4.0)
MCH: 28.5 pg (ref 26.0–34.0)
MCHC: 32.4 g/dL (ref 30.0–36.0)
MCV: 87.8 fL (ref 80.0–100.0)
Monocytes Absolute: 0.5 K/uL (ref 0.1–1.0)
Monocytes Relative: 7 %
Neutro Abs: 3.4 K/uL (ref 1.7–7.7)
Neutrophils Relative %: 52 %
Platelets: 288 K/uL (ref 150–400)
RBC: 4.92 MIL/uL (ref 4.22–5.81)
RDW: 15.3 % (ref 11.5–15.5)
WBC: 6.5 K/uL (ref 4.0–10.5)
nRBC: 0 % (ref 0.0–0.2)

## 2024-01-16 NOTE — ED Provider Notes (Signed)
 Evan Kim   CSN: [id number] Arrival date & time: 01/16/24  1202     Patient presents with: Hematuria   Evan Kim is a 58 y.o. male.  {Add pertinent medical, surgical, social history, OB history to HPI:32947} HPI     58 year old male with a history of hypertension, OSA, cervical stenosis who presents with concern for hematuria  Urine looks like a normal color but will urinate clots Has been coming out with urination, after urinating will be some blood on underpants No black or bloody stools No difficulty urinating Not on any blood thinners No abdominal pain. 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    Reflux    Sleep apnea    pt does not wear CPAP   Ulcer disease      Prior to Admission medications   Medication Sig Start Date End Date Taking? Authorizing Provider  acetaminophen  (TYLENOL ) 500 MG tablet Take 500 mg by mouth every 6 (six) hours as needed for mild pain (pain score 1-3).    [provider]  doxycycline  (VIBRAMYCIN ) 100 MG capsule Take 1 capsule (100 mg total) by mouth 2 (two) times daily. Patient not taking: Reported on 01/13/2024 05/29/23   Ruthell Lonni FALCON, PA-C  EPINEPHrine  0.3 mg/0.3 mL IJ SOAJ injection Inject 0.3 mg into the muscle as needed for anaphylaxis.    [provider]  esomeprazole (NEXIUM) 20 MG capsule Take 20 mg by mouth daily before breakfast.    [provider]  HYDROcodone -acetaminophen  (NORCO) 10-325 MG tablet Take 1 tablet by mouth every 4 (four) hours as needed for severe pain. Patient not taking: Reported on 01/13/2024 06/04/22    Cheryle Debby DELENA, MD  ibuprofen  (ADVIL ) 200 MG tablet Take 200 mg by mouth every 6 (six) hours as needed.    [provider]  ondansetron  (ZOFRAN  ODT) 4 MG disintegrating tablet Take 1 tablet (4 mg total) by mouth every 8 (eight) hours as needed for nausea or vomiting. Patient not taking: Reported on 01/13/2024 06/04/22   Cheryle Debby DELENA, MD    Allergies: Bee venom and Oxycodone     Review of Systems  Updated Vital Signs BP 115/80 (BP Location: Right Arm)   Pulse (!) 55   Temp 98.2 F (36.8 C) (Oral)   Resp 18   SpO2 100%   Physical Exam  (all labs ordered are listed, but only abnormal results are displayed) Labs Reviewed  BASIC METABOLIC PANEL WITH GFR  CBC WITH DIFFERENTIAL/PLATELET  URINALYSIS, ROUTINE W REFLEX MICROSCOPIC    EKG: None  Radiology: CT RENAL STONE STUDY Result Date: 01/15/2024 CLINICAL DATA:  Gross hematuria. EXAM: CT ABDOMEN AND PELVIS WITHOUT CONTRAST TECHNIQUE: Multidetector CT imaging of the abdomen and pelvis was performed following the standard protocol without IV contrast. RADIATION DOSE REDUCTION: This exam was performed according to the departmental dose-optimization program which includes automated exposure control, adjustment of the mA and/or kV according to patient size and/or use of iterative reconstruction technique. COMPARISON:  CT 05/29/2023 FINDINGS: Lower chest: Clear lung bases. Hepatobiliary: Unremarkable unenhanced appearance  of the liver. Decompressed gallbladder. No calcified gallstone. Pancreas: No ductal dilatation or inflammation. Spleen: Normal in size without focal abnormality. Adrenals/Urinary Tract: Stable 1.8 cm left adrenal nodule with Hounsfield units of 2.9 consistent with adenoma. No further follow-up imaging is recommended. Right adrenal gland is normal. No hydronephrosis. No renal calculi. No evidence of focal renal lesion on this unenhanced exam. No perinephric stranding. Decompressed ureters. The urinary bladder is  partially distended. No bladder stone or wall thickening. No obvious bladder lesion. No urethral stone. Stomach/Bowel: Stomach is within normal limits. Appendix appears normal. No evidence of bowel wall thickening, distention, or inflammatory changes. Few scattered colonic diverticula without diverticulitis. Vascular/Lymphatic: Aortic atherosclerosis. No aortic aneurysm. No bulky abdominopelvic adenopathy. Reproductive: Prostate is unremarkable. Other: No free air, free fluid, or intra-abdominal fluid collection. No abdominal wall or inguinal hernia. Musculoskeletal: Posterior fusion L3 through S1. There are no acute or suspicious osseous abnormalities. Remote right rib fractures. IMPRESSION: 1. No renal stones or obstructive uropathy. No explanation for hematuria on this unenhanced exam. 2. Stable left adrenal adenoma. Aortic Atherosclerosis (ICD10-I70.0). Electronically Signed   By: Andrea Gasman M.D.   On: 01/15/2024 18:32    {Document cardiac monitor, telemetry assessment procedure when appropriate:32947} Procedures   Medications Ordered in the ED - No data to display    {Click here for ABCD2, HEART and other calculators REFRESH Kim before signing:1}                              Medical Decision Making Amount and/or Complexity of Data Reviewed Labs: ordered.   ***  {Document critical care time when appropriate  Document review of labs and clinical decision tools ie CHADS2VASC2, etc  Document your independent review of radiology images and any outside records  Document your discussion with family members, caretakers and with consultants  Document social determinants of health affecting pt's care  Document your decision making why or why not admission, treatments were needed:32947:::1}   Final diagnoses:  None    ED Discharge Orders     None

## 2024-01-16 NOTE — Discharge Instructions (Addendum)
 Your kidney function is normal and your urine does not look infected. Please call Urology on Monday to schedule an appointment.

## 2024-01-16 NOTE — ED Triage Notes (Addendum)
 Pt reports blood in urine that started yesterday. Reports been sent here yesterday by pcp for CT, told these were no stones.   Reports still have blood in urine this am and was told to present here by pcp for evaluation,

## 2024-01-17 LAB — URINE CULTURE: Culture: NO GROWTH

## 2024-01-19 ENCOUNTER — Ambulatory Visit: Admitting: Rehabilitation

## 2024-01-21 ENCOUNTER — Ambulatory Visit

## 2024-01-26 ENCOUNTER — Ambulatory Visit

## 2024-01-29 ENCOUNTER — Ambulatory Visit: Admitting: Rehabilitation

## 2024-01-29 DIAGNOSIS — M5459 Other low back pain: Secondary | ICD-10-CM

## 2024-01-29 DIAGNOSIS — R293 Abnormal posture: Secondary | ICD-10-CM

## 2024-01-29 DIAGNOSIS — M5412 Radiculopathy, cervical region: Secondary | ICD-10-CM

## 2024-01-29 DIAGNOSIS — M6281 Muscle weakness (generalized): Secondary | ICD-10-CM

## 2024-01-29 DIAGNOSIS — M542 Cervicalgia: Secondary | ICD-10-CM
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
## 2024-01-30 ENCOUNTER — Encounter: Payer: Self-pay | Admitting: Rehabilitation

## 2024-02-09 ENCOUNTER — Ambulatory Visit

## 2024-02-09 ENCOUNTER — Other Ambulatory Visit: Payer: Self-pay

## 2024-02-09 DIAGNOSIS — M5459 Other low back pain: Secondary | ICD-10-CM | POA: Diagnosis not present

## 2024-02-09 DIAGNOSIS — M6281 Muscle weakness (generalized): Secondary | ICD-10-CM

## 2024-02-09 DIAGNOSIS — M542 Cervicalgia: Secondary | ICD-10-CM

## 2024-02-09 DIAGNOSIS — R293 Abnormal posture: Secondary | ICD-10-CM

## 2024-02-09 DIAGNOSIS — M5412 Radiculopathy, cervical region: Secondary | ICD-10-CM
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
## 2024-02-12 ENCOUNTER — Ambulatory Visit: Attending: Surgery | Admitting: Rehabilitation

## 2024-02-12 DIAGNOSIS — M5412 Radiculopathy, cervical region: Secondary | ICD-10-CM | POA: Diagnosis present

## 2024-02-12 DIAGNOSIS — M542 Cervicalgia: Secondary | ICD-10-CM | POA: Insufficient documentation

## 2024-02-12 DIAGNOSIS — R293 Abnormal posture: Secondary | ICD-10-CM | POA: Diagnosis present

## 2024-02-12 DIAGNOSIS — M6281 Muscle weakness (generalized): Secondary | ICD-10-CM | POA: Insufficient documentation

## 2024-02-12 DIAGNOSIS — M5459 Other low back pain: Secondary | ICD-10-CM | POA: Insufficient documentation
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
## 2024-02-17 ENCOUNTER — Ambulatory Visit

## 2024-02-17 DIAGNOSIS — M542 Cervicalgia: Secondary | ICD-10-CM | POA: Diagnosis not present

## 2024-02-17 DIAGNOSIS — M5459 Other low back pain: Secondary | ICD-10-CM

## 2024-02-17 DIAGNOSIS — R293 Abnormal posture: Secondary | ICD-10-CM

## 2024-02-17 DIAGNOSIS — M5412 Radiculopathy, cervical region: Secondary | ICD-10-CM

## 2024-02-17 DIAGNOSIS — M6281 Muscle weakness (generalized): Secondary | ICD-10-CM
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
## 2024-02-19 ENCOUNTER — Ambulatory Visit

## 2024-02-19 DIAGNOSIS — M6281 Muscle weakness (generalized): Secondary | ICD-10-CM

## 2024-02-19 DIAGNOSIS — R293 Abnormal posture: Secondary | ICD-10-CM

## 2024-02-19 DIAGNOSIS — M542 Cervicalgia: Secondary | ICD-10-CM | POA: Diagnosis not present

## 2024-02-19 DIAGNOSIS — M5459 Other low back pain: Secondary | ICD-10-CM

## 2024-02-19 DIAGNOSIS — M5412 Radiculopathy, cervical region: Secondary | ICD-10-CM
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
## 2024-02-23 ENCOUNTER — Ambulatory Visit: Admitting: Rehabilitation

## 2024-02-26 ENCOUNTER — Ambulatory Visit

## 2024-02-26 DIAGNOSIS — M6281 Muscle weakness (generalized): Secondary | ICD-10-CM

## 2024-02-26 DIAGNOSIS — M5412 Radiculopathy, cervical region: Secondary | ICD-10-CM

## 2024-02-26 DIAGNOSIS — M542 Cervicalgia: Secondary | ICD-10-CM | POA: Diagnosis not present

## 2024-02-26 DIAGNOSIS — M5459 Other low back pain: Secondary | ICD-10-CM

## 2024-02-26 DIAGNOSIS — R293 Abnormal posture: Secondary | ICD-10-CM
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
## 2024-03-01 ENCOUNTER — Telehealth: Payer: Self-pay | Admitting: Rehabilitation

## 2024-03-01 ENCOUNTER — Ambulatory Visit: Admitting: Rehabilitation

## 2024-03-01 NOTE — Telephone Encounter (Signed)
 Patient was a no show for today's visit.   I spoke with him by phone and he thought it was for tomorrow.   Advised him that we will have to D/C from PT due to [facility]'s no show policy.    He understands and is apologetic.   However, he is actually going to begin PT with [facility] on 1/26 since he is already going there for OT so there isn't go to be much of a lapse in care.

## 2024-03-04 ENCOUNTER — Ambulatory Visit

## 2024-03-08 ENCOUNTER — Ambulatory Visit

## 2024-03-11 ENCOUNTER — Ambulatory Visit: Admitting: Rehabilitation
